# Patient Record
Sex: Male | Born: 1945
Health system: Southern US, Community
[De-identification: ages and names within clinical notes are randomized; demographics above are authoritative.]

## PROBLEM LIST (undated history)

## (undated) DIAGNOSIS — Z8719 Personal history of other diseases of the digestive system: Secondary | ICD-10-CM

## (undated) DIAGNOSIS — Z87898 Personal history of other specified conditions: Secondary | ICD-10-CM

## (undated) DIAGNOSIS — N400 Enlarged prostate without lower urinary tract symptoms: Secondary | ICD-10-CM

## (undated) DIAGNOSIS — T8859XA Other complications of anesthesia, initial encounter: Secondary | ICD-10-CM

## (undated) DIAGNOSIS — K219 Gastro-esophageal reflux disease without esophagitis: Secondary | ICD-10-CM

## (undated) DIAGNOSIS — R310 Gross hematuria: Secondary | ICD-10-CM

## (undated) HISTORY — PX: TONSILLECTOMY: SUR1361

## (undated) HISTORY — PX: OTHER SURGICAL HISTORY: SHX169

## (undated) HISTORY — PX: APPENDECTOMY: SHX54

---

## 2004-12-18 ENCOUNTER — Ambulatory Visit (HOSPITAL_COMMUNITY): Admission: RE | Admit: 2004-12-18 | Discharge: 2004-12-18 | Payer: Self-pay | Admitting: Gastroenterology

## 2009-10-18 HISTORY — PX: OTHER SURGICAL HISTORY: SHX169

## 2013-06-14 ENCOUNTER — Encounter (HOSPITAL_COMMUNITY): Payer: Self-pay

## 2013-06-14 ENCOUNTER — Inpatient Hospital Stay (HOSPITAL_COMMUNITY)
Admission: EM | Admit: 2013-06-14 | Discharge: 2013-06-18 | DRG: 585 | Disposition: A | Discharge status: Home / Self Care | Payer: BC Managed Care – PPO | Attending: Surgery | Admitting: Surgery

## 2013-06-14 ENCOUNTER — Emergency Department (HOSPITAL_COMMUNITY): Payer: BC Managed Care – PPO

## 2013-06-14 DIAGNOSIS — K440 Diaphragmatic hernia with obstruction, without gangrene: Principal | ICD-10-CM | POA: Diagnosis present

## 2013-06-14 DIAGNOSIS — Z880 Allergy status to penicillin: Secondary | ICD-10-CM

## 2013-06-14 DIAGNOSIS — IMO0002 Reserved for concepts with insufficient information to code with codable children: Secondary | ICD-10-CM | POA: Diagnosis not present

## 2013-06-14 DIAGNOSIS — K3189 Other diseases of stomach and duodenum: Secondary | ICD-10-CM | POA: Diagnosis present

## 2013-06-14 DIAGNOSIS — Y846 Urinary catheterization as the cause of abnormal reaction of the patient, or of later complication, without mention of misadventure at the time of the procedure: Secondary | ICD-10-CM | POA: Diagnosis not present

## 2013-06-14 DIAGNOSIS — N400 Enlarged prostate without lower urinary tract symptoms: Secondary | ICD-10-CM | POA: Diagnosis present

## 2013-06-14 DIAGNOSIS — R339 Retention of urine, unspecified: Secondary | ICD-10-CM | POA: Diagnosis not present

## 2013-06-14 DIAGNOSIS — R338 Other retention of urine: Secondary | ICD-10-CM

## 2013-06-14 LAB — CBC
MCH: 33.4 pg (ref 26.0–34.0)
Platelets: 325 10*3/uL (ref 150–400)
RBC: 4.97 MIL/uL (ref 4.22–5.81)
WBC: 12.8 10*3/uL — ABNORMAL HIGH (ref 4.0–10.5)

## 2013-06-14 LAB — LIPASE, BLOOD: Lipase: 133 U/L — ABNORMAL HIGH (ref 11–59)

## 2013-06-14 LAB — COMPREHENSIVE METABOLIC PANEL
AST: 23 U/L (ref 0–37)
Albumin: 4.1 g/dL (ref 3.5–5.2)
Chloride: 100 mEq/L (ref 96–112)
Creatinine, Ser: 0.85 mg/dL (ref 0.50–1.35)
Total Bilirubin: 0.9 mg/dL (ref 0.3–1.2)

## 2013-06-14 MED ORDER — LORAZEPAM 2 MG/ML IJ SOLN
1.0000 mg | Freq: Once | INTRAMUSCULAR | Status: AC
  Administered 2013-06-14: 1 mg via INTRAVENOUS
  Filled 2013-06-14: qty 1

## 2013-06-14 MED ORDER — MORPHINE SULFATE 4 MG/ML IJ SOLN
6.0000 mg | Freq: Once | INTRAMUSCULAR | Status: AC
  Administered 2013-06-14: 6 mg via INTRAVENOUS
  Filled 2013-06-14: qty 2

## 2013-06-14 MED ORDER — ONDANSETRON HCL 4 MG/2ML IJ SOLN
4.0000 mg | Freq: Once | INTRAMUSCULAR | Status: AC
  Administered 2013-06-14: 4 mg via INTRAVENOUS
  Filled 2013-06-14: qty 2

## 2013-06-14 MED ORDER — SODIUM CHLORIDE 0.9 % IV BOLUS (SEPSIS)
1000.0000 mL | Freq: Once | INTRAVENOUS | Status: AC
  Administered 2013-06-14: 1000 mL via INTRAVENOUS

## 2013-06-14 NOTE — ED Notes (Signed)
Patient transported to X-ray 

## 2013-06-14 NOTE — ED Notes (Signed)
Pt started vomiting and having abd pain shortly after eating dinner tonight that was cooked with a spicy chili

## 2013-06-15 ENCOUNTER — Emergency Department (HOSPITAL_COMMUNITY): Payer: BC Managed Care – PPO

## 2013-06-15 ENCOUNTER — Inpatient Hospital Stay (HOSPITAL_COMMUNITY): Payer: BC Managed Care – PPO | Admitting: Anesthesiology

## 2013-06-15 ENCOUNTER — Encounter (HOSPITAL_COMMUNITY): Payer: Self-pay | Admitting: Radiology

## 2013-06-15 ENCOUNTER — Encounter (HOSPITAL_COMMUNITY): Payer: Self-pay | Admitting: Anesthesiology

## 2013-06-15 ENCOUNTER — Encounter (HOSPITAL_COMMUNITY): Admission: EM | Disposition: A | Discharge status: Home / Self Care | Payer: Self-pay | Source: Home / Self Care

## 2013-06-15 DIAGNOSIS — K562 Volvulus: Secondary | ICD-10-CM

## 2013-06-15 DIAGNOSIS — K449 Diaphragmatic hernia without obstruction or gangrene: Secondary | ICD-10-CM

## 2013-06-15 DIAGNOSIS — K3189 Other diseases of stomach and duodenum: Secondary | ICD-10-CM | POA: Diagnosis present

## 2013-06-15 DIAGNOSIS — K440 Diaphragmatic hernia with obstruction, without gangrene: Secondary | ICD-10-CM | POA: Diagnosis present

## 2013-06-15 HISTORY — PX: LAPAROSCOPIC NISSEN FUNDOPLICATION: SHX1932

## 2013-06-15 SURGERY — FUNDOPLICATION, NISSEN, LAPAROSCOPIC
Anesthesia: General | Site: Abdomen | Wound class: Clean Contaminated

## 2013-06-15 MED ORDER — POTASSIUM CL IN DEXTROSE 5% 20 MEQ/L IV SOLN
20.0000 meq | INTRAVENOUS | Status: DC
  Administered 2013-06-15: 20 meq via INTRAVENOUS
  Filled 2013-06-15 (×4): qty 1000

## 2013-06-15 MED ORDER — PROMETHAZINE HCL 25 MG RE SUPP: 25.0000 mg | Freq: Four times a day (QID) | RECTAL | Status: DC | PRN

## 2013-06-15 MED ORDER — LACTATED RINGERS IV BOLUS (SEPSIS)
1000.0000 mL | Freq: Once | INTRAVENOUS | Status: AC
  Administered 2013-06-15: 1000 mL via INTRAVENOUS

## 2013-06-15 MED ORDER — LORAZEPAM 2 MG/ML IJ SOLN: 0.5000 mg | Freq: Three times a day (TID) | INTRAMUSCULAR | Status: DC | PRN

## 2013-06-15 MED ORDER — OXYCODONE HCL 5 MG PO TABS: 5.0000 mg | ORAL_TABLET | Freq: Once | ORAL | Status: DC | PRN

## 2013-06-15 MED ORDER — 0.9 % SODIUM CHLORIDE (POUR BTL) OPTIME
TOPICAL | Status: DC | PRN
  Administered 2013-06-15: 1000 mL

## 2013-06-15 MED ORDER — GENTAMICIN IN SALINE 1-0.9 MG/ML-% IV SOLN: 100.0000 mg | INTRAVENOUS | Status: DC

## 2013-06-15 MED ORDER — MAGIC MOUTHWASH
15.0000 mL | Freq: Four times a day (QID) | ORAL | Status: DC | PRN
  Filled 2013-06-15: qty 15

## 2013-06-15 MED ORDER — LACTATED RINGERS IV SOLN
INTRAVENOUS | Status: DC
  Administered 2013-06-15: 1000 mL via INTRAVENOUS
  Administered 2013-06-16: 01:00:00 via INTRAVENOUS

## 2013-06-15 MED ORDER — METOCLOPRAMIDE HCL 5 MG/ML IJ SOLN
5.0000 mg | Freq: Four times a day (QID) | INTRAMUSCULAR | Status: DC | PRN
  Administered 2013-06-15: 10 mg via INTRAVENOUS

## 2013-06-15 MED ORDER — MEPERIDINE HCL 50 MG/ML IJ SOLN: 6.2500 mg | INTRAMUSCULAR | Status: DC | PRN

## 2013-06-15 MED ORDER — MENTHOL 3 MG MT LOZG: 1.0000 | LOZENGE | OROMUCOSAL | Status: DC | PRN

## 2013-06-15 MED ORDER — LACTATED RINGERS IV SOLN
INTRAVENOUS | Status: DC
  Administered 2013-06-15: 1000 mL via INTRAVENOUS

## 2013-06-15 MED ORDER — DEXAMETHASONE SODIUM PHOSPHATE 10 MG/ML IJ SOLN
INTRAMUSCULAR | Status: DC | PRN
  Administered 2013-06-15: 10 mg via INTRAVENOUS

## 2013-06-15 MED ORDER — CLINDAMYCIN PHOSPHATE 600 MG/50ML IV SOLN
600.0000 mg | INTRAVENOUS | Status: DC
  Filled 2013-06-15: qty 50

## 2013-06-15 MED ORDER — NEOSTIGMINE METHYLSULFATE 1 MG/ML IJ SOLN
INTRAMUSCULAR | Status: DC | PRN
  Administered 2013-06-15: 4 mg via INTRAVENOUS

## 2013-06-15 MED ORDER — OXYCODONE HCL 5 MG/5ML PO SOLN
5.0000 mg | Freq: Once | ORAL | Status: DC | PRN
  Filled 2013-06-15: qty 5

## 2013-06-15 MED ORDER — PANTOPRAZOLE SODIUM 40 MG IV SOLR
40.0000 mg | Freq: Two times a day (BID) | INTRAVENOUS | Status: DC
  Administered 2013-06-15 – 2013-06-17 (×6): 40 mg via INTRAVENOUS
  Filled 2013-06-15 (×9): qty 40

## 2013-06-15 MED ORDER — GLYCOPYRROLATE 0.2 MG/ML IJ SOLN
INTRAMUSCULAR | Status: DC | PRN
  Administered 2013-06-15: 0.6 mg via INTRAVENOUS

## 2013-06-15 MED ORDER — ACETAMINOPHEN 650 MG RE SUPP
650.0000 mg | Freq: Four times a day (QID) | RECTAL | Status: DC | PRN
  Administered 2013-06-15: 650 mg via RECTAL
  Filled 2013-06-15: qty 1

## 2013-06-15 MED ORDER — METOPROLOL TARTRATE 1 MG/ML IV SOLN
5.0000 mg | Freq: Four times a day (QID) | INTRAVENOUS | Status: DC | PRN
  Filled 2013-06-15: qty 5

## 2013-06-15 MED ORDER — METOCLOPRAMIDE HCL 5 MG/ML IJ SOLN: 5.0000 mg | Freq: Four times a day (QID) | INTRAMUSCULAR | Status: DC | PRN

## 2013-06-15 MED ORDER — BUPIVACAINE-EPINEPHRINE 0.25% -1:200000 IJ SOLN
INTRAMUSCULAR | Status: AC
  Filled 2013-06-15: qty 1

## 2013-06-15 MED ORDER — MORPHINE SULFATE 4 MG/ML IJ SOLN
6.0000 mg | Freq: Once | INTRAMUSCULAR | Status: AC
  Administered 2013-06-15: 6 mg via INTRAVENOUS
  Filled 2013-06-15: qty 2

## 2013-06-15 MED ORDER — ONDANSETRON HCL 4 MG/2ML IJ SOLN
4.0000 mg | Freq: Four times a day (QID) | INTRAMUSCULAR | Status: DC | PRN
  Administered 2013-06-16: 4 mg via INTRAVENOUS
  Filled 2013-06-15: qty 2

## 2013-06-15 MED ORDER — HYDROMORPHONE HCL PF 1 MG/ML IJ SOLN
INTRAMUSCULAR | Status: DC | PRN
  Administered 2013-06-15 (×2): 1 mg via INTRAVENOUS

## 2013-06-15 MED ORDER — LIP MEDEX EX OINT
1.0000 "application " | TOPICAL_OINTMENT | Freq: Two times a day (BID) | CUTANEOUS | Status: DC
  Administered 2013-06-15 – 2013-06-17 (×4): 1 via TOPICAL

## 2013-06-15 MED ORDER — HYDROMORPHONE HCL PF 1 MG/ML IJ SOLN: 0.2500 mg | INTRAMUSCULAR | Status: DC | PRN

## 2013-06-15 MED ORDER — IOHEXOL 300 MG/ML  SOLN
100.0000 mL | Freq: Once | INTRAMUSCULAR | Status: AC | PRN
  Administered 2013-06-15: 100 mL via INTRAVENOUS

## 2013-06-15 MED ORDER — LIDOCAINE HCL (PF) 2 % IJ SOLN
INTRAMUSCULAR | Status: DC | PRN
  Administered 2013-06-15: 20 mg

## 2013-06-15 MED ORDER — FENTANYL CITRATE 0.05 MG/ML IJ SOLN
INTRAMUSCULAR | Status: DC | PRN
  Administered 2013-06-15: 50 ug via INTRAVENOUS
  Administered 2013-06-15: 100 ug via INTRAVENOUS
  Administered 2013-06-15 (×2): 50 ug via INTRAVENOUS

## 2013-06-15 MED ORDER — CLINDAMYCIN PHOSPHATE 900 MG/50ML IV SOLN
900.0000 mg | INTRAVENOUS | Status: AC
  Administered 2013-06-15: 900 mg via INTRAVENOUS
  Filled 2013-06-15: qty 50

## 2013-06-15 MED ORDER — POTASSIUM CL IN DEXTROSE 5% 20 MEQ/L IV SOLN
20.0000 meq | INTRAVENOUS | Status: DC
  Administered 2013-06-15: 20 meq via INTRAVENOUS
  Filled 2013-06-15: qty 1000

## 2013-06-15 MED ORDER — MIDAZOLAM HCL 5 MG/5ML IJ SOLN
INTRAMUSCULAR | Status: DC | PRN
  Administered 2013-06-15 (×2): 2 mg via INTRAVENOUS

## 2013-06-15 MED ORDER — BUPIVACAINE-EPINEPHRINE 0.25% -1:200000 IJ SOLN
INTRAMUSCULAR | Status: DC | PRN
  Administered 2013-06-15: 50 mL

## 2013-06-15 MED ORDER — GENTAMICIN SULFATE 40 MG/ML IJ SOLN
340.0000 mg | INTRAVENOUS | Status: AC
  Administered 2013-06-15: 340 mg via INTRAVENOUS
  Filled 2013-06-15: qty 8.5

## 2013-06-15 MED ORDER — ROCURONIUM BROMIDE 100 MG/10ML IV SOLN
INTRAVENOUS | Status: DC | PRN
  Administered 2013-06-15: 30 mg via INTRAVENOUS
  Administered 2013-06-15: 20 mg via INTRAVENOUS
  Administered 2013-06-15: 15 mg via INTRAVENOUS

## 2013-06-15 MED ORDER — HYDROMORPHONE HCL PF 1 MG/ML IJ SOLN: 1.0000 mg | INTRAMUSCULAR | Status: DC | PRN

## 2013-06-15 MED ORDER — SUCCINYLCHOLINE CHLORIDE 20 MG/ML IJ SOLN
INTRAMUSCULAR | Status: DC | PRN
  Administered 2013-06-15: 100 mg via INTRAVENOUS

## 2013-06-15 MED ORDER — ONDANSETRON HCL 4 MG PO TABS: 4.0000 mg | ORAL_TABLET | Freq: Three times a day (TID) | ORAL | Status: DC | PRN

## 2013-06-15 MED ORDER — OXYCODONE HCL 5 MG PO TABS: 5.0000 mg | ORAL_TABLET | ORAL | Status: DC | PRN

## 2013-06-15 MED ORDER — PROMETHAZINE HCL 25 MG/ML IJ SOLN: 6.2500 mg | INTRAMUSCULAR | Status: DC | PRN

## 2013-06-15 MED ORDER — PHENOL 1.4 % MT LIQD: 2.0000 | OROMUCOSAL | Status: DC | PRN

## 2013-06-15 MED ORDER — PROMETHAZINE HCL 25 MG/ML IJ SOLN: 12.5000 mg | Freq: Four times a day (QID) | INTRAMUSCULAR | Status: DC | PRN

## 2013-06-15 MED ORDER — ONDANSETRON HCL 4 MG/2ML IJ SOLN
INTRAMUSCULAR | Status: DC | PRN
  Administered 2013-06-15: 4 mg via INTRAVENOUS

## 2013-06-15 MED ORDER — IOHEXOL 300 MG/ML  SOLN
50.0000 mL | Freq: Once | INTRAMUSCULAR | Status: AC | PRN
  Administered 2013-06-15: 50 mL via ORAL

## 2013-06-15 MED ORDER — LACTATED RINGERS IR SOLN
Status: DC | PRN
  Administered 2013-06-15: 1000 mL

## 2013-06-15 MED ORDER — FAMOTIDINE IN NACL 20-0.9 MG/50ML-% IV SOLN
20.0000 mg | INTRAVENOUS | Status: DC
  Administered 2013-06-15 – 2013-06-17 (×3): 20 mg via INTRAVENOUS
  Filled 2013-06-15 (×5): qty 50

## 2013-06-15 MED ORDER — PROPOFOL 10 MG/ML IV BOLUS
INTRAVENOUS | Status: DC | PRN
  Administered 2013-06-15: 200 mg via INTRAVENOUS

## 2013-06-15 MED ORDER — ALUM & MAG HYDROXIDE-SIMETH 200-200-20 MG/5ML PO SUSP: 30.0000 mL | Freq: Four times a day (QID) | ORAL | Status: DC | PRN

## 2013-06-15 MED ORDER — DIPHENHYDRAMINE HCL 50 MG/ML IJ SOLN: 12.5000 mg | Freq: Four times a day (QID) | INTRAMUSCULAR | Status: DC | PRN

## 2013-06-15 MED ORDER — FENTANYL CITRATE 0.05 MG/ML IJ SOLN
100.0000 ug | Freq: Once | INTRAMUSCULAR | Status: AC
  Administered 2013-06-15: 100 ug via INTRAVENOUS
  Filled 2013-06-15: qty 2

## 2013-06-15 MED ORDER — BISACODYL 10 MG RE SUPP
10.0000 mg | Freq: Two times a day (BID) | RECTAL | Status: DC | PRN
  Administered 2013-06-16: 10 mg via RECTAL
  Filled 2013-06-15: qty 1

## 2013-06-15 SURGICAL SUPPLY — 67 items
APPLIER CLIP 5 13 M/L LIGAMAX5 (MISCELLANEOUS)
APPLIER CLIP ROT 10 11.4 M/L (STAPLE)
CABLE HIGH FREQUENCY MONO STRZ (ELECTRODE) ×2 IMPLANT
CANISTER SUCTION 2500CC (MISCELLANEOUS) IMPLANT
CLIP APPLIE 5 13 M/L LIGAMAX5 (MISCELLANEOUS) IMPLANT
CLIP APPLIE ROT 10 11.4 M/L (STAPLE) IMPLANT
CLOTH BEACON ORANGE TIMEOUT ST (SAFETY) ×2 IMPLANT
DECANTER SPIKE VIAL GLASS SM (MISCELLANEOUS) ×2 IMPLANT
DRAIN CHANNEL 19F RND (DRAIN) ×2 IMPLANT
DRAIN CHANNEL RND F F (WOUND CARE) IMPLANT
DRAIN PENROSE 18X1/2 LTX STRL (DRAIN) IMPLANT
DRAPE LAPAROSCOPIC ABDOMINAL (DRAPES) ×2 IMPLANT
DRAPE WARM FLUID 44X44 (DRAPE) ×2 IMPLANT
DRSG TEGADERM 2-3/8X2-3/4 SM (GAUZE/BANDAGES/DRESSINGS) ×8 IMPLANT
ELECT REM PT RETURN 9FT ADLT (ELECTROSURGICAL) ×2
ELECTRODE REM PT RTRN 9FT ADLT (ELECTROSURGICAL) ×1 IMPLANT
EVACUATOR SILICONE 100CC (DRAIN) ×2 IMPLANT
FELT TEFLON 4 X1 (Mesh General) ×2 IMPLANT
FILTER SMOKE EVAC LAPAROSHD (FILTER) ×2 IMPLANT
GLOVE BIOGEL PI IND STRL 7.0 (GLOVE) ×1 IMPLANT
GLOVE BIOGEL PI INDICATOR 7.0 (GLOVE) ×1
GLOVE ECLIPSE 8.0 STRL XLNG CF (GLOVE) IMPLANT
GLOVE INDICATOR 8.0 STRL GRN (GLOVE) IMPLANT
GLOVE SURG SS PI 7.5 STRL IVOR (GLOVE) ×4 IMPLANT
GLOVE SURG SS PI 8.5 STRL IVOR (GLOVE) ×1
GLOVE SURG SS PI 8.5 STRL STRW (GLOVE) ×1 IMPLANT
GOWN SRG XL XLNG 56XLVL 4 (GOWN DISPOSABLE) ×1 IMPLANT
GOWN STRL NON-REIN LRG LVL3 (GOWN DISPOSABLE) IMPLANT
GOWN STRL NON-REIN XL XLG LVL4 (GOWN DISPOSABLE) ×1
GOWN STRL REIN 2XL XLG LVL4 (GOWN DISPOSABLE) ×2 IMPLANT
GOWN STRL REIN XL XLG (GOWN DISPOSABLE) ×4 IMPLANT
KIT BASIN OR (CUSTOM PROCEDURE TRAY) ×2 IMPLANT
LEGGING LITHOTOMY PAIR STRL (DRAPES) ×2 IMPLANT
MARKER SKIN DUAL TIP RULER LAB (MISCELLANEOUS) ×2 IMPLANT
NS IRRIG 1000ML POUR BTL (IV SOLUTION) ×2 IMPLANT
PENCIL BUTTON HOLSTER BLD 10FT (ELECTRODE) IMPLANT
PUNCH BIOPSY 3 (MISCELLANEOUS) IMPLANT
SCALPEL HARMONIC ACE (MISCELLANEOUS) ×2 IMPLANT
SCISSORS LAP 5X35 DISP (ENDOMECHANICALS) ×2 IMPLANT
SEALANT SURGICAL APPL DUAL CAN (MISCELLANEOUS) IMPLANT
SET IRRIG TUBING LAPAROSCOPIC (IRRIGATION / IRRIGATOR) ×2 IMPLANT
SLEEVE Z-THREAD 5X100MM (TROCAR) ×6 IMPLANT
SOLUTION ANTI FOG 6CC (MISCELLANEOUS) IMPLANT
SPONGE DRAIN TRACH 4X4 STRL 2S (GAUZE/BANDAGES/DRESSINGS) IMPLANT
STAPLER VISISTAT 35W (STAPLE) ×2 IMPLANT
SUT 0 0 DBL MH NEEDLE ETHIBOND (SUTURE) IMPLANT
SUT ETHIBOND 0 (SUTURE) ×6 IMPLANT
SUT ETHIBOND 0 36 GRN (SUTURE) ×6 IMPLANT
SUT ETHIBOND CT1 BRD #0 30IN (SUTURE) IMPLANT
SUT ETHIBOND NAB CT1 #1 30IN (SUTURE) ×4 IMPLANT
SUT ETHILON 2 0 PS N (SUTURE) IMPLANT
SUT MNCRL AB 4-0 PS2 18 (SUTURE) ×2 IMPLANT
SUT PDS AB 3-0 SH 27 (SUTURE) IMPLANT
SUT PDS AB 4-0 SH 27 (SUTURE) IMPLANT
SUT VICRYL 0 TIES 12 18 (SUTURE) IMPLANT
TIP INNERVISION DETACH 40FR (MISCELLANEOUS) IMPLANT
TIP INNERVISION DETACH 50FR (MISCELLANEOUS) IMPLANT
TIP INNERVISION DETACH 56FR (MISCELLANEOUS) ×2 IMPLANT
TIPS INNERVISION DETACH 40FR (MISCELLANEOUS)
TOWEL OR 17X26 10 PK STRL BLUE (TOWEL DISPOSABLE) ×2 IMPLANT
TRAY FOLEY CATH 14FRSI W/METER (CATHETERS) IMPLANT
TRAY FOLEY METER SIL LF 16FR (CATHETERS) ×2 IMPLANT
TRAY LAP CHOLE (CUSTOM PROCEDURE TRAY) ×2 IMPLANT
TROCAR BLADELESS OPT 5 100 (ENDOMECHANICALS) ×2 IMPLANT
TROCAR XCEL NON-BLD 11X100MML (ENDOMECHANICALS) ×2 IMPLANT
TUBING FILTER THERMOFLATOR (ELECTROSURGICAL) ×2 IMPLANT
WATER STERILE IRR 1500ML POUR (IV SOLUTION) ×2 IMPLANT

## 2013-06-15 NOTE — Progress Notes (Signed)
PACU Nursing Note: Pt became extremely combative, hitting, bitting, scratching at staff, pt attempting to get out of bed, pt states he is at home. Safety devices applied to wrist, sr x 4 remain up and bed in lowest position, 3 RN's with MD required to keep pt in bed and fighting with staff, MDA at bedside, w/ CRNA, 2mg  Versed IV administered by CRNA, pt placed on ETCO2 along with cont POX monitoring, additional orders rec'd from Surgeon and MDA.

## 2013-06-15 NOTE — ED Provider Notes (Addendum)
CSN: [id number]     Arrival date & time 06/14/13  2214 History   First MD Initiated Contact with Patient 06/14/13 2309     Chief Complaint  Patient presents with  . Emesis    HPI Patient reports developing acute onset abdominal pain in his upper abdomen as well as associated nausea and vomiting shortly after finishing dinner.  He has a known history of hiatal hernia.  He denies diarrhea.  He was in his normal state of health earlier today.  He's never had these symptoms before.  History gallstones.  He has had an appendectomy before in the past.  His pain is moderate to severe in severity at this time.  Nothing worsens or improves his symptoms.  He is actively vomiting during my examination.   History reviewed. No pertinent past medical history. History reviewed. No pertinent past surgical history. History reviewed. No pertinent family history. History  Substance Use Topics  . Smoking status: Never Smoker   . Smokeless tobacco: Not on file  . Alcohol Use: No    Review of Systems  All other systems reviewed and are negative.    Allergies  Penicillins and Betadine  Home Medications   Current Outpatient Rx  Name  Route  Sig  Dispense  Refill  . aluminum & magnesium hydroxide-simethicone (MYLANTA) 500-450-40 MG/5ML suspension   Oral   Take by mouth every 6 (six) hours as needed for indigestion.         Marland Kitchen aspirin 325 MG tablet   Oral   Take 325 mg by mouth daily.         . calcium carbonate (TUMS - DOSED IN MG ELEMENTAL CALCIUM) 500 MG chewable tablet   Oral   Chew 1 tablet by mouth daily.         . famotidine (PEPCID) 20 MG tablet   Oral   Take 20 mg by mouth 2 (two) times daily.         Marland Kitchen ibuprofen (ADVIL,MOTRIN) 200 MG tablet   Oral   Take 200 mg by mouth every 6 (six) hours as needed for pain.          BP 163/88  Pulse 98  Temp(Src) 98.7 F (37.1 C) (Oral)  Resp 16  SpO2 95% Physical Exam  Nursing note and vitals reviewed. Constitutional: He is  oriented to person, place, and time. He appears well-developed and well-nourished.  Uncomfortable appearing  HENT:  Head: Normocephalic and atraumatic.  Eyes: EOM are normal.  Neck: Normal range of motion.  Cardiovascular: Normal rate, regular rhythm, normal heart sounds and intact distal pulses.   Pulmonary/Chest: Effort normal and breath sounds normal. No respiratory distress.  Abdominal: Soft. He exhibits no distension.  Abdominal tenderness without guarding or rebound.  Genitourinary: Rectum normal.  Musculoskeletal: Normal range of motion.  Neurological: He is alert and oriented to person, place, and time.  Skin: Skin is warm and dry.  Psychiatric: He has a normal mood and affect. Judgment normal.    ED Course  Procedures (including critical care time) Labs Review Labs Reviewed  CBC - Abnormal; Notable for the following:    WBC 12.8 (*)    All other components within normal limits  LIPASE, BLOOD - Abnormal; Notable for the following:    Lipase 133 (*)    All other components within normal limits  COMPREHENSIVE METABOLIC PANEL - Abnormal; Notable for the following:    Glucose, Bld 145 (*)    GFR calc non Af Denyse Dago  89 (*)    All other components within normal limits  TROPONIN I    Date: 06/15/2013  Rate: 87  Rhythm: normal sinus rhythm  QRS Axis: normal  Intervals: normal  ST/T Wave abnormalities: normal  Conduction Disutrbances: none  Narrative Interpretation:   Old EKG Reviewed: No significant changes noted     Imaging Review Dg Chest 1 View  06/15/2013   *RADIOLOGY REPORT*  Clinical Data: Emesis, hiatal hernia  CHEST - 1 VIEW  Comparison: 06/15/2013  Findings: Increased gas and decreased fluid within the large hernia though the degree of distension is similar.  The NG tube  descends straight into the subdiaphragmatic level/left upper quadrant, raising possibility that the hernia is paresophageal.  IMPRESSION: NG tube tip over the left upper quadrant.  Findings as  above.   Original Report Authenticated By: Jearld Lesch, M.D.   Ct Abdomen Pelvis W Contrast  06/15/2013   *RADIOLOGY REPORT*  Clinical Data: Emesis, hiatal hernia.  CT ABDOMEN AND PELVIS WITH CONTRAST  Technique:  Multidetector CT imaging of the abdomen and pelvis was performed following the standard protocol during bolus administration of intravenous contrast.  Contrast: OMNIPAQUE IOHEXOL 300 MG/ML  SOLN  Comparison: 06/15/2013 radiographs  Findings: Mild left lung base opacity, favor atelectasis.  Normal heart size.  Coronary artery calcifications.  Intrathoracic stomach with organoaxial rotation.  Narrowing of the gastric outlet due to the hernia with no contrast reaching the duodenum.  NG tube descends into the stomach. No pneumatosis.  A couple subcentimeter hepatic hypodensities are nonspecific however favored to reflect biliary cyst or hamartoma.  Hypodensity adjacent to the falciform, favored to reflect focal fat or variant perfusion.  Partially contracted gallbladder with mild wall thickening, nonspecific.  No biliary ductal dilatation.  No radiodense gallstones.  Unremarkable spleen, pancreas, adrenal glands.  Subcentimeter hypodensity right kidney.  Otherwise, symmetric renal enhancement.  No hydroureteronephrosis.  No CT evidence for colitis.  Appendix not identified.  No right lower quadrant inflammation.  Small bowel loops are decompressed and normal in course.  No free intraperitoneal air or fluid.  No lymphadenopathy.  Thin-walled bladder.  Marked prostatomegaly with heterogeneous/nodular enhancement.  Tiny fat containing left umbilical hernia.  Normal caliber aorta and branch vessels with mild scattered atherosclerotic disease.  No acute osseous finding.  Mild multilevel degenerative change.  IMPRESSION: Findings are most in keeping with obstructing organoaxial gastric volvulus. Recommend surgical consultation.  Marked prostatomegaly with heterogeneous/nodular enhancement. Recommend  non emergent workup to include PSA and digital rectal exam.  Discussed via telephone with Dr. Patria Mane at 03:50 a.m. on 06/15/2013.   Original Report Authenticated By: Jearld Lesch, M.D.   Dg Abd Acute W/chest  06/15/2013   *RADIOLOGY REPORT*  Clinical Data: Upper abdominal pain  ACUTE ABDOMEN SERIES (ABDOMEN 2 VIEW & CHEST 1 VIEW)  Comparison: None.  Findings: Lungs predominately clear.  Large hiatal hernia. Cardiomediastinal contours otherwise within normal range.  No free intraperitoneal air.  Large stool burden.  Relative paucity of small bowel gas.  Mild curvature of the spine.  Osteopenia.  IMPRESSION: Large hiatal hernia and a paucity of small bowel gas, can be seen with fluid filled or decompressed loops. Recommend NG tube decompression and cross-sectional imaging if clinically concerned for acute gastric obstruction or volvulus.  Large stool burden.   Original Report Authenticated By: Jearld Lesch, M.D.   I personally reviewed the imaging tests through PACS system I reviewed available ER/hospitalization records through the EMR I personally discussed the findings  with the radiologist  MDM   1. Organoaxial gastric volvulus    4:05 AM CT scan concerning for organoaxial gastric volvulus.  NG tube was placed earlier in the patient's ER stay.  Pain is been treated nausea is been treated.  NG is on intermittent suction at this time.  General surgery consultation pending    Lyanne Co, MD 06/15/13 4098  Lyanne Co, MD 06/15/13 [phone]

## 2013-06-15 NOTE — ED Notes (Signed)
Patient transported to CT 

## 2013-06-15 NOTE — Preoperative (Signed)
Beta Blockers   Reason not to administer Beta Blockers:Not Applicable 

## 2013-06-15 NOTE — Progress Notes (Signed)
Nutrition Education Note  Received consult for diet education s/p gastric fundoplication. Pt scheduled to have this surgery later on today and is currently NPO. Met with pt and wife to discuss postoperative diet. Instructed them on pureed diet with emphasis on small frequent meals low in fat and sugar and avoiding carbonated beverages/straws. Teach back method used. Handouts provided.   Expect good compliance.  Body mass index is 23.31 kg/(m^2). Pt meets criteria for normal weight based on current BMI.  Labs and medications reviewed. Pt with elevated lipase. Pt eating well PTA, smaller meals r/t early satiety. Had 5 pound intentional weight loss since May r/t increasing exercising. No further nutrition interventions warranted at this time. RD contact information provided. If additional nutrition issues arise, please re-consult RD.    Levon Hedger MS, RD, LDN [phone] Pager [phone] After Hours Pager

## 2013-06-15 NOTE — Progress Notes (Signed)
ACU Nursing Note: Re-assessment: pt resting very quietly at this time, will attempt to open eyes when name called, no further combative activity noted, wrist safety devices remain on, side rails x 4 remain up with bed remaining in lowest position, PACU RN at bedside continously, VS frequency changed to q91min for monitoring, MDA at bedside, updated of pt status, no further orders rec'd

## 2013-06-15 NOTE — Progress Notes (Signed)
Lee Collins [id number] 04/13/1946  CARE TEAM:  PCP: No primary provider on file.  Outpatient Care Team: Patient has no care team.  Inpatient Treatment Team: Treatment Team: Attending Provider: Md Montez Morita, MD; Technician: Areta Haber, [place]; Consulting Physician: Ardeth Sportsman, MD; Respiratory Therapist: Toula Moos, RRT   Subjective:  NGT in place C/o dry mouth No more nausea or vomiting. No abdominal pain. Wife in room.  Given my expertise in advance laparoscopic and foregut surgery, I was asked by my partner to evaluate the patient.   The patient is familiar with & trusts Dr. Gerrit Friends.  They feel comfortable with me being involved in his surgical care  The patient has a history of a known hiatal hernia for some time.  He thought it been mild in the past.  Struggled with significant reflux intermittently.  Has used Maalox and anti-gas medications very regularly.  Has some early satiety with gradual transition to smaller & more frequent meals.  This has worsened to the point of severe bloating and pain with eating, especially with the emergency room visit last night.    No prior history of dysphagia to solids or liquids.  No family history of esophageal cancer.  He does not smoke.  He is quite physically active and runs every day.  He had an appendectomy but no other abdominal surgeries.  No cardiac or pulmonary issues.  He has never had an upper endoscopy.  He has been evaluated by Dorena Cookey with [facility] gastroenterology in the past for colonoscopies.  He has never had any upper endoscopies done.  No personal nor family history of GI/colon cancer, inflammatory bowel disease, irritable bowel syndrome, allergy such as Celiac Sprue, dietary/dairy problems, colitis, ulcers nor gastritis.  No recent sick contacts/gastroenteritis.  No travel outside the country.  No changes in diet.    Objective:  Vital signs:  Filed Vitals:   06/14/13 2226 06/15/13 0050 06/15/13 0427  BP:  166/93 163/88 132/73  Pulse: 98  96  Temp: 98.7 F (37.1 C)  98.5 F (36.9 C)  TempSrc: Oral  Oral  Resp: 18 16 20   SpO2: 97% 95% 93%       Intake/Output   Yesterday:    This shift:     Bowel function:  Flatus: n BM: n  Drain: n/a  Physical Exam:  General: Pt awake/alert/oriented x4 in no acute distress Eyes: PERRL, normal EOM.  Sclera clear.  No icterus Neuro: CN II-XII intact w/o focal sensory/motor deficits. Lymph: No head/neck/groin lymphadenopathy Psych:  No delerium/psychosis/paranoia HENT: Normocephalic, Mucus membranes moist.  No thrush.  NG tube and left Neer.  No bleeding.  No hoarseness. Neck: Supple, No tracheal deviation Chest: No chest wall pain w good excursion CV:  Pulses intact.  Regular rhythm MS: Normal AROM mjr joints.  No obvious deformity Abdomen: Soft.  Nondistended.  Nontender.  No rebound or guarding.  No evidence of peritonitis.  No incarcerated hernias. Ext:  SCDs BLE.  No mjr edema.  No cyanosis Skin: No petechiae / purpura   Problem List:   Principal Problem:   Organoaxial gastric volvulus Active Problems:   Incarcerated paraesophageal hernia   Assessment  Lee Collins  67 y.o. male       Large paraesophageal hiatal hernia with 80% incarcerated in the right and left mediastinum, Volvulized.  Resulting in gastric obstruction.  Plan:  -I think at this point he is fully obstructed.  He requires urgent decompression of his incarcerated paraesophageal hiatal hernia with  hiatal closure and probable fundoplication:  The anatomy & physiology of the foregut and anti-reflux mechanism was discussed.  The pathophysiology of hiatal herniation and GERD was discussed.  Natural history risks without surgery was discussed.   The patient's symptoms are not adequately controlled by medicines and other non-operative treatments.  I feel the risks of no intervention will lead to serious problems that outweigh the operative risks; therefore, I  recommended surgery to reduce the hiatal hernia out of the chest and fundoplication to rebuild the anti-reflux valve and control reflux better.  Need for a thorough workup to rule out the differential diagnosis and plan treatment was explained.  I explained laparoscopic techniques with possible need for an open approach.  Risks such as bleeding, infection, abscess, leak, need for further treatment, heart attack, death, and other risks were discussed.   I noted a good likelihood this will help address the problem.  Goals of post-operative recovery were discussed as well.  Possibility that this will not correct all symptoms was explained.  Post-operative dysphagia, need for short-term liquid & pureed diet, inability to vomit, possibility of reherniation, possible need for medicines to help control symptoms in addition to surgery were discussed.  We will work to minimize complications.   Educational handouts further explaining the pathology, treatment options, and dysphagia diet was given as well.  Questions were answered.  The patient expresses understanding & wishes to proceed with surgery.   We will try and do this later today.  It is reasonable start out laparoscopically.  The hiatal defect is not with massive, so bili can be just a primary care.  Sometimes biologic mesh is used to buttress the repair.  I discussed this at length with the patient and his wife.  They wish to be aggressive and proceed with surgery.  Given the lack of preoperative manometry, we will probably do short fundoplication.  Hold off on gastrostomy tube unless difficult to fully reduce the  -VTE prophylaxis- SCDs, etc -mobilize as tolerated to help recovery  Ardeth Sportsman, M.D., F.A.C.S. Gastrointestinal and Minimally Invasive Surgery [facility] [address], [address] [place], [place] [address] [phone] Main / Paging   06/15/2013   Results:   Labs: Results for orders placed during the  hospital encounter of 06/14/13 (from the past 48 hour(s))  TROPONIN I     Status: None   Collection Time    06/14/13  9:30 PM      Result Value Range   Troponin I <0.30  <0.30 ng/mL   Comment:            Due to the release kinetics of cTnI,     a negative result within the first hours     of the onset of symptoms does not rule out     myocardial infarction with certainty.     If myocardial infarction is still suspected,     repeat the test at appropriate intervals.  CBC     Status: Abnormal   Collection Time    06/14/13 11:10 PM      Result Value Range   WBC 12.8 (*) 4.0 - 10.5 K/uL   RBC 4.97  4.22 - 5.81 MIL/uL   Hemoglobin 16.6  13.0 - 17.0 g/dL   HCT 47.4  25.9 - 56.3 %   MCV 94.2  78.0 - 100.0 fL   MCH 33.4  26.0 - 34.0 pg   MCHC 35.5  30.0 - 36.0 g/dL  RDW 11.9  11.5 - 15.5 %   Platelets 325  150 - 400 K/uL  LIPASE, BLOOD     Status: Abnormal   Collection Time    06/14/13 11:10 PM      Result Value Range   Lipase 133 (*) 11 - 59 U/L  COMPREHENSIVE METABOLIC PANEL     Status: Abnormal   Collection Time    06/14/13 11:10 PM      Result Value Range   Sodium 139  135 - 145 mEq/L   Potassium 3.6  3.5 - 5.1 mEq/L   Chloride 100  96 - 112 mEq/L   CO2 29  19 - 32 mEq/L   Glucose, Bld 145 (*) 70 - 99 mg/dL   BUN 18  6 - 23 mg/dL   Creatinine, Ser 9.60  0.50 - 1.35 mg/dL   Calcium 9.6  8.4 - 45.4 mg/dL   Total Protein 7.5  6.0 - 8.3 g/dL   Albumin 4.1  3.5 - 5.2 g/dL   AST 23  0 - 37 U/L   ALT 20  0 - 53 U/L   Alkaline Phosphatase 77  39 - 117 U/L   Total Bilirubin 0.9  0.3 - 1.2 mg/dL   GFR calc non Af Amer 89 (*) >90 mL/min   GFR calc Af Amer >90  >90 mL/min   Comment: (NOTE)     The eGFR has been calculated using the CKD EPI equation.     This calculation has not been validated in all clinical situations.     eGFR's persistently <90 mL/min signify possible Chronic Kidney     Disease.    Imaging / Studies: Dg Chest 1 View  06/15/2013   *RADIOLOGY REPORT*   Clinical Data: Emesis, hiatal hernia  CHEST - 1 VIEW  Comparison: 06/15/2013  Findings: Increased gas and decreased fluid within the large hernia though the degree of distension is similar.  The NG tube  descends straight into the subdiaphragmatic level/left upper quadrant, raising possibility that the hernia is paresophageal.  IMPRESSION: NG tube tip over the left upper quadrant.  Findings as above.   Original Report Authenticated By: Jearld Lesch, M.D.   Ct Abdomen Pelvis W Contrast  06/15/2013   *RADIOLOGY REPORT*  Clinical Data: Emesis, hiatal hernia.  CT ABDOMEN AND PELVIS WITH CONTRAST  Technique:  Multidetector CT imaging of the abdomen and pelvis was performed following the standard protocol during bolus administration of intravenous contrast.  Contrast: OMNIPAQUE IOHEXOL 300 MG/ML  SOLN  Comparison: 06/15/2013 radiographs  Findings: Mild left lung base opacity, favor atelectasis.  Normal heart size.  Coronary artery calcifications.  Intrathoracic stomach with organoaxial rotation.  Narrowing of the gastric outlet due to the hernia with no contrast reaching the duodenum.  NG tube descends into the stomach. No pneumatosis.  A couple subcentimeter hepatic hypodensities are nonspecific however favored to reflect biliary cyst or hamartoma.  Hypodensity adjacent to the falciform, favored to reflect focal fat or variant perfusion.  Partially contracted gallbladder with mild wall thickening, nonspecific.  No biliary ductal dilatation.  No radiodense gallstones.  Unremarkable spleen, pancreas, adrenal glands.  Subcentimeter hypodensity right kidney.  Otherwise, symmetric renal enhancement.  No hydroureteronephrosis.  No CT evidence for colitis.  Appendix not identified.  No right lower quadrant inflammation.  Small bowel loops are decompressed and normal in course.  No free intraperitoneal air or fluid.  No lymphadenopathy.  Thin-walled bladder.  Marked prostatomegaly with heterogeneous/nodular  enhancement.  Tiny fat  containing left umbilical hernia.  Normal caliber aorta and branch vessels with mild scattered atherosclerotic disease.  No acute osseous finding.  Mild multilevel degenerative change.  IMPRESSION: Findings are most in keeping with obstructing organoaxial gastric volvulus. Recommend surgical consultation.  Marked prostatomegaly with heterogeneous/nodular enhancement. Recommend non emergent workup to include PSA and digital rectal exam.  Discussed via telephone with Dr. Patria Mane at 03:50 a.m. on 06/15/2013.   Original Report Authenticated By: Jearld Lesch, M.D.   Dg Abd Acute W/chest  06/15/2013   *RADIOLOGY REPORT*  Clinical Data: Upper abdominal pain  ACUTE ABDOMEN SERIES (ABDOMEN 2 VIEW & CHEST 1 VIEW)  Comparison: None.  Findings: Lungs predominately clear.  Large hiatal hernia. Cardiomediastinal contours otherwise within normal range.  No free intraperitoneal air.  Large stool burden.  Relative paucity of small bowel gas.  Mild curvature of the spine.  Osteopenia.  IMPRESSION: Large hiatal hernia and a paucity of small bowel gas, can be seen with fluid filled or decompressed loops. Recommend NG tube decompression and cross-sectional imaging if clinically concerned for acute gastric obstruction or volvulus.  Large stool burden.   Original Report Authenticated By: Jearld Lesch, M.D.    Medications / Allergies: per chart  Antibiotics: Anti-infectives   None

## 2013-06-15 NOTE — Anesthesia Postprocedure Evaluation (Signed)
  Anesthesia Post-op Note  Patient: Lee Collins  Procedure(s) Performed: Procedure(s) (LRB): LAPAROSCOPIC PARAESOPHAGEAL HERNIA   (N/A)  Patient Location: PACU  Anesthesia Type: General  Level of Consciousness: awake and alert   Airway and Oxygen Therapy: Patient Spontanous Breathing  Post-op Pain: mild  Post-op Assessment: Post-op Vital signs reviewed, Patient's Cardiovascular Status Stable, Respiratory Function Stable, Patent Airway and No signs of Nausea or vomiting  Last Vitals:  Filed Vitals:   06/15/13 2050  BP: 126/60  Pulse: 74  Temp:   Resp: 12    Post-op Vital Signs: stable   Complications: No apparent anesthesia complications

## 2013-06-15 NOTE — Anesthesia Preprocedure Evaluation (Addendum)
Anesthesia Evaluation  Patient identified by MRN, date of birth, ID band Patient awake    Reviewed: Allergy & Precautions, H&P , NPO status , Patient's Chart, lab work & pertinent test results  Airway Mallampati: II TM Distance: >3 FB Neck ROM: Full    Dental no notable dental hx. (+) Dental Advisory Given   Pulmonary neg pulmonary ROS,  breath sounds clear to auscultation  Pulmonary exam normal       Cardiovascular negative cardio ROS  Rhythm:Regular Rate:Normal     Neuro/Psych negative neurological ROS  negative psych ROS   GI/Hepatic negative GI ROS, Neg liver ROS,   Endo/Other  negative endocrine ROS  Renal/GU negative Renal ROS  negative genitourinary   Musculoskeletal negative musculoskeletal ROS (+)   Abdominal   Peds negative pediatric ROS (+)  Hematology negative hematology ROS (+)   Anesthesia Other Findings   Reproductive/Obstetrics negative OB ROS                          Anesthesia Physical Anesthesia Plan  ASA: II  Anesthesia Plan: General   Post-op Pain Management:    Induction: Intravenous, Rapid sequence and Cricoid pressure planned  Airway Management Planned: Oral ETT  Additional Equipment:   Intra-op Plan:   Post-operative Plan: Extubation in OR  Informed Consent: I have reviewed the patients History and Physical, chart, labs and discussed the procedure including the risks, benefits and alternatives for the proposed anesthesia with the patient or authorized representative who has indicated his/her understanding and acceptance.   Dental advisory given  Plan Discussed with: CRNA and Surgeon  Anesthesia Plan Comments:        Anesthesia Quick Evaluation

## 2013-06-15 NOTE — Progress Notes (Signed)
PACU Nursing Note: Pt ready for discharge from PACU, pt arousable, follows commands, able to take deep breaths, VSS, pt able to state name, reason for hospitalization and states location. Safety devices removed prior to tx out of PACU.

## 2013-06-15 NOTE — ED Notes (Signed)
Output of 50ml per NG tube.

## 2013-06-15 NOTE — Transfer of Care (Signed)
Immediate Anesthesia Transfer of Care Note  Patient: Lee Collins  Procedure(s) Performed: Procedure(s) (LRB): LAPAROSCOPIC PARAESOPHAGEAL HERNIA   (N/A)  Patient Location: PACU  Anesthesia Type: General  Level of Consciousness: sedated, patient cooperative and responds to stimulaton  Airway & Oxygen Therapy: Patient Spontanous Breathing and Patient connected to face mask oxgen  Post-op Assessment: Report given to PACU RN and Post -op Vital signs reviewed and stable  Post vital signs: Reviewed and stable  Complications: No apparent anesthesia complications

## 2013-06-15 NOTE — ED Notes (Signed)
Pt able to tolerate approximately 1 1/2 cup of contrast via NG tube before feeling like he was going to vomit.

## 2013-06-15 NOTE — H&P (Signed)
Lee Collins is an 67 y.o. male.   Chief Complaint: nausea vomiting after eating tonight and pain HPI: asked to see patient at request of Dr. Patria Mane for acute onset of nausea vomiting and upper abdominal pain. This occurred after dinner last night. The pain is severe located in the upper abdomen and lower chest and is associated with nausea and vomiting. Patient has history of hiatal hernia. He has never had pain like this before. CT scan showed large hiatal hernia with gastric volvulus and obstruction. Patient feels better since nasogastric tube was placed. No hypotension. No tachycardia.   History reviewed. No pertinent past medical history.  History reviewed. No pertinent past surgical history.  History reviewed. No pertinent family history. Social History:  reports that he has never smoked. He does not have any smokeless tobacco history on file. He reports that he does not drink alcohol. His drug history is not on file.  Allergies:  Allergies  Allergen Reactions  . Penicillins   . Betadine [Povidone Iodine] Rash     (Not in a hospital admission)  Results for orders placed during the hospital encounter of 06/14/13 (from the past 48 hour(s))  TROPONIN I     Status: None   Collection Time    06/14/13  9:30 PM      Result Value Range   Troponin I <0.30  <0.30 ng/mL   Comment:            Due to the release kinetics of cTnI,     a negative result within the first hours     of the onset of symptoms does not rule out     myocardial infarction with certainty.     If myocardial infarction is still suspected,     repeat the test at appropriate intervals.  CBC     Status: Abnormal   Collection Time    06/14/13 11:10 PM      Result Value Range   WBC 12.8 (*) 4.0 - 10.5 K/uL   RBC 4.97  4.22 - 5.81 MIL/uL   Hemoglobin 16.6  13.0 - 17.0 g/dL   HCT 40.9  81.1 - 91.4 %   MCV 94.2  78.0 - 100.0 fL   MCH 33.4  26.0 - 34.0 pg   MCHC 35.5  30.0 - 36.0 g/dL   RDW 78.2  95.6 - 21.3 %   Platelets 325  150 - 400 K/uL  LIPASE, BLOOD     Status: Abnormal   Collection Time    06/14/13 11:10 PM      Result Value Range   Lipase 133 (*) 11 - 59 U/L  COMPREHENSIVE METABOLIC PANEL     Status: Abnormal   Collection Time    06/14/13 11:10 PM      Result Value Range   Sodium 139  135 - 145 mEq/L   Potassium 3.6  3.5 - 5.1 mEq/L   Chloride 100  96 - 112 mEq/L   CO2 29  19 - 32 mEq/L   Glucose, Bld 145 (*) 70 - 99 mg/dL   BUN 18  6 - 23 mg/dL   Creatinine, Ser 0.86  0.50 - 1.35 mg/dL   Calcium 9.6  8.4 - 57.8 mg/dL   Total Protein 7.5  6.0 - 8.3 g/dL   Albumin 4.1  3.5 - 5.2 g/dL   AST 23  0 - 37 U/L   ALT 20  0 - 53 U/L   Alkaline Phosphatase 77  39 - 117  U/L   Total Bilirubin 0.9  0.3 - 1.2 mg/dL   GFR calc non Af Amer 89 (*) >90 mL/min   GFR calc Af Amer >90  >90 mL/min   Comment: (NOTE)     The eGFR has been calculated using the CKD EPI equation.     This calculation has not been validated in all clinical situations.     eGFR's persistently <90 mL/min signify possible Chronic Kidney     Disease.   Dg Chest 1 View  06/15/2013   *RADIOLOGY REPORT*  Clinical Data: Emesis, hiatal hernia  CHEST - 1 VIEW  Comparison: 06/15/2013  Findings: Increased gas and decreased fluid within the large hernia though the degree of distension is similar.  The NG tube  descends straight into the subdiaphragmatic level/left upper quadrant, raising possibility that the hernia is paresophageal.  IMPRESSION: NG tube tip over the left upper quadrant.  Findings as above.   Original Report Authenticated By: Jearld Lesch, M.D.   Ct Abdomen Pelvis W Contrast  06/15/2013   *RADIOLOGY REPORT*  Clinical Data: Emesis, hiatal hernia.  CT ABDOMEN AND PELVIS WITH CONTRAST  Technique:  Multidetector CT imaging of the abdomen and pelvis was performed following the standard protocol during bolus administration of intravenous contrast.  Contrast: OMNIPAQUE IOHEXOL 300 MG/ML  SOLN  Comparison: 06/15/2013  radiographs  Findings: Mild left lung base opacity, favor atelectasis.  Normal heart size.  Coronary artery calcifications.  Intrathoracic stomach with organoaxial rotation.  Narrowing of the gastric outlet due to the hernia with no contrast reaching the duodenum.  NG tube descends into the stomach. No pneumatosis.  A couple subcentimeter hepatic hypodensities are nonspecific however favored to reflect biliary cyst or hamartoma.  Hypodensity adjacent to the falciform, favored to reflect focal fat or variant perfusion.  Partially contracted gallbladder with mild wall thickening, nonspecific.  No biliary ductal dilatation.  No radiodense gallstones.  Unremarkable spleen, pancreas, adrenal glands.  Subcentimeter hypodensity right kidney.  Otherwise, symmetric renal enhancement.  No hydroureteronephrosis.  No CT evidence for colitis.  Appendix not identified.  No right lower quadrant inflammation.  Small bowel loops are decompressed and normal in course.  No free intraperitoneal air or fluid.  No lymphadenopathy.  Thin-walled bladder.  Marked prostatomegaly with heterogeneous/nodular enhancement.  Tiny fat containing left umbilical hernia.  Normal caliber aorta and branch vessels with mild scattered atherosclerotic disease.  No acute osseous finding.  Mild multilevel degenerative change.  IMPRESSION: Findings are most in keeping with obstructing organoaxial gastric volvulus. Recommend surgical consultation.  Marked prostatomegaly with heterogeneous/nodular enhancement. Recommend non emergent workup to include PSA and digital rectal exam.  Discussed via telephone with Dr. Patria Mane at 03:50 a.m. on 06/15/2013.   Original Report Authenticated By: Jearld Lesch, M.D.   Dg Abd Acute W/chest  06/15/2013   *RADIOLOGY REPORT*  Clinical Data: Upper abdominal pain  ACUTE ABDOMEN SERIES (ABDOMEN 2 VIEW & CHEST 1 VIEW)  Comparison: None.  Findings: Lungs predominately clear.  Large hiatal hernia. Cardiomediastinal contours  otherwise within normal range.  No free intraperitoneal air.  Large stool burden.  Relative paucity of small bowel gas.  Mild curvature of the spine.  Osteopenia.  IMPRESSION: Large hiatal hernia and a paucity of small bowel gas, can be seen with fluid filled or decompressed loops. Recommend NG tube decompression and cross-sectional imaging if clinically concerned for acute gastric obstruction or volvulus.  Large stool burden.   Original Report Authenticated By: Jearld Lesch, M.D.  Review of Systems  Constitutional: Negative for fever and chills.  HENT: Negative.   Eyes: Negative.   Respiratory: Negative.   Cardiovascular: Negative.   Gastrointestinal: Positive for nausea, vomiting and abdominal pain.  Genitourinary: Negative.   Musculoskeletal: Negative.   Skin: Negative for rash.  Neurological: Negative.   Endo/Heme/Allergies: Negative.   Psychiatric/Behavioral: Negative.     Blood pressure 132/73, pulse 96, temperature 98.5 F (36.9 C), temperature source Oral, resp. rate 20, SpO2 93.00%. Physical Exam  Constitutional: He is oriented to person, place, and time. He appears well-developed and well-nourished.  HENT:  Head: Normocephalic and atraumatic.  Eyes: EOM are normal. Pupils are equal, round, and reactive to light.  Neck: Normal range of motion. Neck supple.  Cardiovascular: Normal rate and regular rhythm.   Respiratory: Effort normal and breath sounds normal.  GI: Soft. He exhibits no distension. There is no tenderness.  Musculoskeletal: Normal range of motion.  Neurological: He is alert and oriented to person, place, and time.  Skin: Skin is warm and dry.  Psychiatric: He has a normal mood and affect. His behavior is normal. Judgment and thought content normal.     Assessment/Plan History of hiatal hernia with acute gastric volvulus with obstruction.  I discussed the disease process with the patient and his wife at the bedside. Currently he feels better with the  NG tube in place. I explained the need for exploratory laparotomy with probable gastropexy or gastrostomy tube and closure of hiatus if possible. I discussed the progression of this process which could lead to gastric perforation, gastric gangrene and subsequent removal stomach. Currently, he has no signs of this process. The patient and wife wish time to think about this. I explained the operative procedure the patient. There is no good nonoperative treatment of this. Open and laparoscopic techniques can be utilized but in emergency settings open surgery is sometimes necessary. Dr. Maisie Fus is the doctor week and I will discuss with her as well.  Ahley Bulls A. 06/15/2013, 5:42 AM

## 2013-06-15 NOTE — Op Note (Signed)
06/14/2013 - 06/15/2013  7:26 PM  PATIENT:  Lee Collins  67 y.o. male  Patient Care Team: Ardeth Sportsman, MD as Consulting Physician (General Surgery) Barrie Folk, MD as Consulting Physician (Gastroenterology)  PRE-OPERATIVE DIAGNOSIS:  Incarcerated paraesophogeal hernia  POST-OPERATIVE DIAGNOSIS:  Incarcerated paraesophageal hiatal hernia  PROCEDURE:   1. Laparoscopic reduction of paraesophageal hiatal hernia 2. Primary repair of hiatal hernia over pledgets. 3. Type II mediastinal dissection. 4. Anterior & posterior gastropexy. 5. Nissen fundoplication 2cm over a 56-French bougie  SURGEON:  Ardeth Sportsman, MD Lodema Pilot, DO - Assist  ANESTHESIA:   local and general  EBL:     Delay start of Pharmacological VTE agent (>24hrs) due to surgical blood loss or risk of bleeding:  no  ANESTHESIA: 1. General anesthesia. 2. Local anesthetic in a field block around all port sites.  SPECIMEN:  Mediastinal hernia sac (not sent).  DRAINS:  A 19-French Blake drain goes from the right upper quadrant along the lesser curvature of the stomach into the mediastinum.  COUNTS:  YES  PLAN OF CARE: Admit to inpatient   PATIENT DISPOSITION:  PACU - hemodynamically stable.  INDICATION:   Patient with symptomatic paraesophageal hiatal hernia.  Worsening symptoms of heartburn or reflux.  Early satiety.  Developed intractable nausea and vomiting.  Found to have gastric obstruction from organoaxial volvulus of giant paraesophageal hernia.  Improved the nasogastric tube.  Because of the urgent obstruction, I recommended urgent repair.  The anatomy & physiology of the foregut and anti-reflux mechanism was discussed.  The pathophysiology of hiatal herniation and GERD was discussed.  Natural history risks without surgery was discussed.   The patient's symptoms are not adequately controlled by medicines and other non-operative treatments.  I feel the risks of no intervention will lead to serious  problems that outweigh the operative risks; therefore, I recommended surgery to reduce the hiatal hernia out of the chest and fundoplication to rebuild the anti-reflux valve and control reflux better.  Need for a thorough workup to rule out the differential diagnosis and plan treatment was explained.  I explained laparoscopic techniques with possible need for an open approach.  Risks such as bleeding, infection, abscess, leak, need for further treatment, heart attack, death, and other risks were discussed.   I noted a good likelihood this will help address the problem.  Goals of post-operative recovery were discussed as well.  Possibility that this will not correct all symptoms was explained.  Post-operative dysphagia, need for short-term liquid & pureed diet, inability to vomit, possibility of reherniation, possible need for medicines to help control symptoms in addition to surgery were discussed.  We will work to minimize complications.   Educational handouts further explaining the pathology, treatment options, and dysphagia diet was given as well.  Questions were answered.  The patient expresses understanding & wishes to proceed with surgery.  OR FINDINGS:   Moderate-sized paraesophageal hiatal hernia with 90% of the stomach in the mediastinum.  There was a 11x 8cm hiatal defect.  It is a primary repair over pledgets.   The patient has a 2 cm Nissen fundoplication that was done over 56-French bougie.  The patient has had anterior and posterior gastropexy.  DESCRIPTION:   Informed consent was confirmed.  The patient received IV antibiotics prior to incision.  The underwent general anesthesia without difficulty.  A Foley catheter sterilely placed.  The patient was positioned in split leg with arms tucked. The abdomen was prepped and draped in the sterile  fashion.  Surgical time-out confirmed our plan.  I placed a 5 mm port in the left upper quadrant paramedian region using optical entry  technique with the patient in steep reverse Trendelenburg and left side up.  Entry was clean.  We induced carbon dioxide insufflation.  Camera inspection revealed no injury.  Under direct visualization, I placed 5 mm port in the right mid abdomen and a 10 mm port on the anterior axillary line on the left subcostal ridge.  I also placed a 5 mm port in the left subxiphoid region under direct visualization.  I removed that and placed an Omega-shaped rigid Nathanson liver retractor to lift the left lateral sector of the liver anteriorly to expose the esophageal hiatus.  This was secured to the bed using the iron man system.  I placed another 5mm port in the subxiphoid region.  We went through the hepatogastric ligament and was able to get into the anterior mediastinum between the apex of the right crus and the anterior hernia sac.  We grasped the anterior mediastinal sac at the apex of the crus.  I scored through that and got into the anterior mediastinum.  I was able to free the mediastinal sac from its attachments to the pericardium and bilateral pleura using primarily focused gentle blunt dissection as well as focused ultrasonic Harmonic dissection.  I transected phrenoesophageal attachments to the inner right crus, preserving a two centimeter cuff of mediastinal sac until I found the base of the crura.  I then came around anteriorly on the left side and freed up the phrenoesophageal attachments of the mediastinal sac on the medial part of the left crus on the superior half.  I did careful mediastinal dissection to free the mediastinal sac.  With that, we could relieve the suction cup affect of the hernia sac and help reduce the stomach back down into the abdomen, flipped back approriately.    We ligated the short gastrics along the lesser curvature of the stomach about a third way down and then came up over the fundus.  We released the attachments of the stomach to the retroperitoneum until we were able  to connect with the prior dissection on the left crus.  We completed the release of phrenoesophageal attachments to the medial part of the left crus down to its base.  With this, we had circumferential mobilization.   We placed the stomach and esophagus on axial tension.  I then did a type 2 mediastinal dissection where I freed the esophagus from its attachments to the aorta, pleura, and pericardium using primarily gentle blunt as well as focused ultrasonic dissection.  We saw the anterior vagus nerve, it was mostly intact anteriorly.  The posterior was more intact.  We preserved it at all times.  I procedded to dissect about 30 cm up into the mediastinum.  I did have a breech in the right pleural cavity and mobilizing the esophagus.   With that I could straighten out the esophagus and get 2 cm of intra-abdominal length of the esophagus at a best estimation.  I could easily stretched three more centimeters down for a total of 5 cm of intra-abdominal esophagus on gentle tension.  I freed the anterior mediastinal sac.  It was rather bulky.  We saw the anterior vagus nerve and freed the sac left of the vagus and skeletonized some medial to the vagus nerve as well.  I dissected out & removed the anterior epiphrenic pad.  And equally large posterior mediastinal  sac then we carefully skeletonized and freed off the posterior vagus nerve.  Posterior epiphrenic fat was skeletonized and freed off as well. With that, I could better define the esophagogastric junction.  I confirmed and I did have 3 cm of intra-abdominal esophageal length off tension.  I did mobilize the right crus offer its attachments to the liver and diaphragm.  I also freed some retroperitoneal attachments off the left posterior crus as well.  That allowed greater mobility area I did release the intra-abdominal carbon dioxide pressure down to 12 mm.  That allowed the for the crura to come together more easily.  I brought the fundus of the stomach  posterior to the esophagus over tothe right side.  The wrap was mobile with the classic shoe shine maneuver.  Wrap became together gently. We reflected the stomach left laterally and closed the esophageal hiatus using 0 Ethibond stitch using horizontal mattress stitches with pledgets on both sides.  I did that x3 stitches.  The crura had good substance and they came together well without any tension.  Because he was NOT a smoker, morbidly obese, Had a giant effect that took much tension to close, nor quality of his crura; I held off on doing a buttress of the repair with biologic mesh.  I reset up the and Nissen wrap and did a posterior gastropexy by taking two of #1 Ethibond stitches to the posterior part of the right side of the wrap and thru the crural closure and tied that down for good posterior gastropexy up against the hiatal closure.  I then did a left anterior gastropexy taking the apex of the left side of the wrap and tacked it to the left anterior crura just posterior to the phrenic vein. I tied that down.  This helped the wrap for completion.  Next, Dr. Eilene Ghazi, the anesthesiologist, passed a 56-French bougie transorally.  It was a lighted bougie and we did do this under axial tension.  It passed down easily without resistance.  Because of the good quality of his esophagus and no history of prior dysphagia, I then did a classic 2 cm Nissen fundoplication on the true  esophagus above the cardia using Ethibond stitch in the left side of the wrap, then anterior esophagus, and then right side of the wrap and tied that down.  I did 3 stitches.  I measured it and it was 2 cm in length.  We removed the bougie.  It was intact & without blood  The wrap was soft and floppy.  I placed a drain as noted above.  I did irrigation and ensured hemostasis.  I saw no evidence of any leak or perforation or other abnormality.  I removed the [facility] liver retractor under direct visualization.  I evacuated carbon  dioxide and removed the ports.  The skin was closed with Monocryl and sterile dressings applied.  The patient is being extubated and brought back to the recovery room.  I am about to discuss it with the patient's family.  Detailed postoperative instructions are written as well and will be given to the patient upon discharge.  Nutrition consultation has already been mader.  Esophagram swallow study ordered for the morning to evaluate the wrap.

## 2013-06-16 ENCOUNTER — Inpatient Hospital Stay (HOSPITAL_COMMUNITY): Payer: BC Managed Care – PPO

## 2013-06-16 LAB — BASIC METABOLIC PANEL
BUN: 15 mg/dL (ref 6–23)
CO2: 27 mEq/L (ref 19–32)
Chloride: 97 mEq/L (ref 96–112)
Glucose, Bld: 115 mg/dL — ABNORMAL HIGH (ref 70–99)
Potassium: 3.4 mEq/L — ABNORMAL LOW (ref 3.5–5.1)

## 2013-06-16 MED ORDER — POTASSIUM CL IN DEXTROSE 5% 20 MEQ/L IV SOLN
20.0000 meq | INTRAVENOUS | Status: DC
  Administered 2013-06-16: 20 meq via INTRAVENOUS
  Filled 2013-06-16: qty 1000

## 2013-06-16 MED ORDER — TAMSULOSIN HCL 0.4 MG PO CAPS
0.4000 mg | ORAL_CAPSULE | Freq: Every day | ORAL | Status: DC
  Administered 2013-06-16 – 2013-06-18 (×3): 0.4 mg via ORAL
  Filled 2013-06-16 (×3): qty 1

## 2013-06-16 MED ORDER — HYDROMORPHONE HCL PF 1 MG/ML IJ SOLN
0.5000 mg | INTRAMUSCULAR | Status: DC | PRN
  Administered 2013-06-16: 1 mg via INTRAVENOUS
  Filled 2013-06-16: qty 1

## 2013-06-16 MED ORDER — IOHEXOL 300 MG/ML  SOLN
50.0000 mL | Freq: Once | INTRAMUSCULAR | Status: AC | PRN
  Administered 2013-06-16: 50 mL via ORAL

## 2013-06-16 MED ORDER — KCL IN DEXTROSE-NACL 20-5-0.45 MEQ/L-%-% IV SOLN
INTRAVENOUS | Status: DC
  Administered 2013-06-16: 12:00:00 via INTRAVENOUS
  Filled 2013-06-16 (×2): qty 1000

## 2013-06-16 MED ORDER — SODIUM CHLORIDE 0.9 % IJ SOLN: 3.0000 mL | INTRAMUSCULAR | Status: DC | PRN

## 2013-06-16 MED ORDER — OXYCODONE HCL 5 MG PO TABS
5.0000 mg | ORAL_TABLET | ORAL | Status: DC | PRN
  Administered 2013-06-16 (×2): 10 mg via ORAL
  Filled 2013-06-16 (×2): qty 2

## 2013-06-16 MED ORDER — LACTATED RINGERS IV BOLUS (SEPSIS): 1000.0000 mL | Freq: Three times a day (TID) | INTRAVENOUS | Status: DC | PRN

## 2013-06-16 MED ORDER — ACETAMINOPHEN 500 MG PO TABS
1000.0000 mg | ORAL_TABLET | Freq: Three times a day (TID) | ORAL | Status: DC
  Administered 2013-06-16 – 2013-06-18 (×7): 1000 mg via ORAL
  Filled 2013-06-16 (×10): qty 2

## 2013-06-16 MED ORDER — SODIUM CHLORIDE 0.9 % IJ SOLN
3.0000 mL | Freq: Two times a day (BID) | INTRAMUSCULAR | Status: DC
  Administered 2013-06-16 – 2013-06-17 (×3): 3 mL via INTRAVENOUS

## 2013-06-16 MED ORDER — SODIUM CHLORIDE 0.9 % IJ SOLN
3.0000 mL | Freq: Two times a day (BID) | INTRAMUSCULAR | Status: DC
  Administered 2013-06-16: 3 mL via INTRAVENOUS

## 2013-06-16 NOTE — Progress Notes (Signed)
1 Day Post-Op  Subjective: Complains of lower abdominal pain but otherwise not much discomfort.  No breathing problems.  Had urinary retention last night and catheter replaced.  Has had bloody output and decreased output since catheter replaced with some blood leaking from around the meatus  Objective: Vital signs in last 24 hours: Temp:  [97.4 F (36.3 C)-99 F (37.2 C)] 98.5 F (36.9 C) (08/30 0551) Pulse Rate:  [74-106] 77 (08/30 0551) Resp:  [8-20] 18 (08/30 0551) BP: (117-172)/(60-91) 156/83 mmHg (08/30 0551) SpO2:  [95 %-100 %] 97 % (08/30 0551) Last BM Date: 06/13/13  Intake/Output from previous day: 08/29 0701 - 08/30 0700 In: 1703.3 [P.O.:80; I.V.:1623.3] Out: 1640 [Urine:1615; Drains:25] Intake/Output this shift:    General appearance: alert, cooperative and no distress Resp: nonlabored Cardio: normal rate, regular GI: soft, moderate suprapubic tenderness, no significant upper abdominal tenderness, ND, wounds without infection, JP ss Male genitalia: catheter in place with thin bloody output but not that much in bag,  he has small amount of bloody leakage from meatus Extremities: SCD's bilat  Lab Results:   Recent Labs  06/14/13 2310  WBC 12.8*  HGB 16.6  HCT 46.8  PLT 325   BMET  Recent Labs  06/14/13 2310  NA 139  K 3.6  CL 100  CO2 29  GLUCOSE 145*  BUN 18  CREATININE 0.85  CALCIUM 9.6   PT/INR No results found for this basename: LABPROT, INR,  in the last 72 hours ABG No results found for this basename: PHART, PCO2, PO2, HCO3,  in the last 72 hours  Studies/Results: Dg Chest 1 View  06/15/2013   *RADIOLOGY REPORT*  Clinical Data: Emesis, hiatal hernia  CHEST - 1 VIEW  Comparison: 06/15/2013  Findings: Increased gas and decreased fluid within the large hernia though the degree of distension is similar.  The NG tube  descends straight into the subdiaphragmatic level/left upper quadrant, raising possibility that the hernia is paresophageal.   IMPRESSION: NG tube tip over the left upper quadrant.  Findings as above.   Original Report Authenticated By: Jearld Lesch, M.D.   Ct Abdomen Pelvis W Contrast  06/15/2013   *RADIOLOGY REPORT*  Clinical Data: Emesis, hiatal hernia.  CT ABDOMEN AND PELVIS WITH CONTRAST  Technique:  Multidetector CT imaging of the abdomen and pelvis was performed following the standard protocol during bolus administration of intravenous contrast.  Contrast: OMNIPAQUE IOHEXOL 300 MG/ML  SOLN  Comparison: 06/15/2013 radiographs  Findings: Mild left lung base opacity, favor atelectasis.  Normal heart size.  Coronary artery calcifications.  Intrathoracic stomach with organoaxial rotation.  Narrowing of the gastric outlet due to the hernia with no contrast reaching the duodenum.  NG tube descends into the stomach. No pneumatosis.  A couple subcentimeter hepatic hypodensities are nonspecific however favored to reflect biliary cyst or hamartoma.  Hypodensity adjacent to the falciform, favored to reflect focal fat or variant perfusion.  Partially contracted gallbladder with mild wall thickening, nonspecific.  No biliary ductal dilatation.  No radiodense gallstones.  Unremarkable spleen, pancreas, adrenal glands.  Subcentimeter hypodensity right kidney.  Otherwise, symmetric renal enhancement.  No hydroureteronephrosis.  No CT evidence for colitis.  Appendix not identified.  No right lower quadrant inflammation.  Small bowel loops are decompressed and normal in course.  No free intraperitoneal air or fluid.  No lymphadenopathy.  Thin-walled bladder.  Marked prostatomegaly with heterogeneous/nodular enhancement.  Tiny fat containing left umbilical hernia.  Normal caliber aorta and branch vessels with mild scattered  atherosclerotic disease.  No acute osseous finding.  Mild multilevel degenerative change.  IMPRESSION: Findings are most in keeping with obstructing organoaxial gastric volvulus. Recommend surgical consultation.  Marked  prostatomegaly with heterogeneous/nodular enhancement. Recommend non emergent workup to include PSA and digital rectal exam.  Discussed via telephone with Dr. Patria Mane at 03:50 a.m. on 06/15/2013.   Original Report Authenticated By: Jearld Lesch, M.D.   Dg Abd Acute W/chest  06/15/2013   *RADIOLOGY REPORT*  Clinical Data: Upper abdominal pain  ACUTE ABDOMEN SERIES (ABDOMEN 2 VIEW & CHEST 1 VIEW)  Comparison: None.  Findings: Lungs predominately clear.  Large hiatal hernia. Cardiomediastinal contours otherwise within normal range.  No free intraperitoneal air.  Large stool burden.  Relative paucity of small bowel gas.  Mild curvature of the spine.  Osteopenia.  IMPRESSION: Large hiatal hernia and a paucity of small bowel gas, can be seen with fluid filled or decompressed loops. Recommend NG tube decompression and cross-sectional imaging if clinically concerned for acute gastric obstruction or volvulus.  Large stool burden.   Original Report Authenticated By: Jearld Lesch, M.D.    Anti-infectives: Anti-infectives   Start     Dose/Rate Route Frequency Ordered Stop   06/15/13 0926  gentamicin (GARAMYCIN) 340 mg in dextrose 5 % 100 mL IVPB     340 mg 217 mL/hr over 30 Minutes Intravenous 60 min pre-op 06/15/13 0927 06/15/13 1610   06/15/13 0900  clindamycin (CLEOCIN) IVPB 600 mg  Status:  Discontinued    Comments:  Pharmacy may adjust dosing strength, interval, or rate of medication as needed for optimal therapy for the patient Send with patient on call to the OR.  Anesthesia to complete antibiotic administration <81min prior to incision per [facility].   600 mg 100 mL/hr over 30 Minutes Intravenous On call to O.R. 06/15/13 0454 06/15/13 0858   06/15/13 0900  gentamicin (GARAMYCIN) IVPB 100 mg  Status:  Discontinued    Comments:  Anesthesia and/or Pharmacy may adjust dosing strength, schedule, rate of infusion, etc as needed to optimize therapy Send with patient on call to the OR.  Anesthesia  to complete antibiotic administration <26min prior to incision per [facility].   100 mg 200 mL/hr over 30 Minutes Intravenous On call to O.R. 06/15/13 0981 06/15/13 0926   06/15/13 0857  clindamycin (CLEOCIN) IVPB 900 mg     900 mg 100 mL/hr over 30 Minutes Intravenous 60 min pre-op 06/15/13 0858 06/15/13 1615      Assessment/Plan: s/p Procedure(s): LAPAROSCOPIC PARAESOPHAGEAL HERNIA   (N/A) He seems to be doing great from his [facility] repair.  Will check UGI and if okay, will start liquids.  His main issue has been urinary retention and hematuria.  I discussed this with the urologist and we will try removing catheter and see if he can void.  If unable to void with flomax then will replace the foley with coude catheter. The hematuria is likely foley trauma.  Will check BMP as well for kidney function.  LOS: 2 days    Lodema Pilot DAVID 06/16/2013

## 2013-06-16 NOTE — Consult Note (Signed)
Consult:  Gross hematuria, urinary retention  History of Present Illness: Pt POD#1 -- 1. Laparoscopic reduction of paraesophageal hiatal hernia  2. Primary repair of hiatal hernia over pledgets.  3. Type II mediastinal dissection.  4. Anterior & posterior gastropexy.  5. Nissen fundoplication 2cm over a 56-French bougie  Pt developed retention last night and a 14 Fr foley placed which did not drain well. Dr. Biagio Quint called me this AM with report bladder scan 125 ml with foley in place, blood around meatus. I instructed to remove foley and if pt developed retention he would need something like an 18Fr coude placed. Pt could not void and a 14 Fr placed then a 20 Fr foley.   Pt is a pt of mine with h/o BPH, 125 g prostate, elevated PSA, gross hematuria (former [facility] pt) who is on surveillance. Tried Avodart before but does not want to take meds.     History reviewed. No pertinent past medical history. Past Surgical History  Procedure Laterality Date  . Appendectomy    . Left elbow surgery    . Vocal cord surgery    . Right rotator cuff repair    . Tonsillectomy      Home Medications:  Prescriptions prior to admission  Medication Sig Dispense Refill  . aluminum & magnesium hydroxide-simethicone (MYLANTA) 500-450-40 MG/5ML suspension Take by mouth every 6 (six) hours as needed for indigestion.      Marland Kitchen aspirin 325 MG tablet Take 325 mg by mouth daily.      . calcium carbonate (TUMS - DOSED IN MG ELEMENTAL CALCIUM) 500 MG chewable tablet Chew 1 tablet by mouth daily.      . famotidine (PEPCID) 20 MG tablet Take 20 mg by mouth 2 (two) times daily.      Marland Kitchen ibuprofen (ADVIL,MOTRIN) 200 MG tablet Take 200 mg by mouth every 6 (six) hours as needed for pain.       Allergies:  Allergies  Allergen Reactions  . Penicillins   . Betadine [Povidone Iodine] Rash    History reviewed. No pertinent family history. Social History:  reports that he has never smoked. He has never used smokeless  tobacco. He reports that he does not drink alcohol or use illicit drugs.  ROS: A complete review of systems was performed.  All systems are negative except for pertinent findings as noted. @ROS @   Physical Exam:  Vital signs in last 24 hours: Temp:  [97.4 F (36.3 C)-98.7 F (37.1 C)] 98.5 F (36.9 C) (08/30 1437) Pulse Rate:  [74-106] 96 (08/30 1437) Resp:  [8-18] 18 (08/30 1437) BP: (117-172)/(60-98) 154/98 mmHg (08/30 1437) SpO2:  [95 %-100 %] 98 % (08/30 1437) General:  Alert and oriented, No acute distress HEENT: Normocephalic, atraumatic Neck: No JVD or lymphadenopathy Cardiovascular: Regular rate and rhythm Lungs: Regular rate and effort Abdomen: Soft, nontender, no abdominal masses, bladder distended Back: No CVA tenderness Extremities: No edema Neurologic: Grossly intact GU: 20Fr foley in place.  Procedure - 20 Fr foley will not irrigate. Catheter is not mobile. Deflated balloon and it will not advance through prostate, removed. Penis prepped and 22Fr hematuria, coude catheter placed. Drained 1 L mostly clear urine. Irrigated a liter with no clots, equal return. Did not feel he needed CBI. Bladder irrigated normally.   Laboratory Data:  Results for orders placed during the hospital encounter of 06/14/13 (from the past 24 hour(s))  BASIC METABOLIC PANEL     Status: Abnormal   Collection Time  06/16/13 11:10 AM      Result Value Range   Sodium 131 (*) 135 - 145 mEq/L   Potassium 3.4 (*) 3.5 - 5.1 mEq/L   Chloride 97  96 - 112 mEq/L   CO2 27  19 - 32 mEq/L   Glucose, Bld 115 (*) 70 - 99 mg/dL   BUN 15  6 - 23 mg/dL   Creatinine, Ser 1.61  0.50 - 1.35 mg/dL   Calcium 9.0  8.4 - 09.6 mg/dL   GFR calc non Af Amer 86 (*) >90 mL/min   GFR calc Af Amer >90  >90 mL/min   No results found for this or any previous visit (from the past 240 hour(s)). Creatinine:  Recent Labs  06/14/13 2310 06/16/13 1110  CREATININE 0.85 0.92    Impression/Assessment/Plan: Gross  hematuria Urinary retention   From BPH, large prostate and foley not reaching bladder -- continue tamsulosin and foley. He can return to my office next week for void trial.    Antony Haste 06/16/2013, 5:29 PM

## 2013-06-17 MED ORDER — TAMSULOSIN HCL 0.4 MG PO CAPS: 0.4000 mg | ORAL_CAPSULE | Freq: Every day | ORAL | Status: DC

## 2013-06-17 NOTE — Progress Notes (Signed)
2 Days Post-Op  Subjective: Feeling much better today.  Catheter placed by urology last night.  Tolerating clears  Objective: Vital signs in last 24 hours: Temp:  [98 F (36.7 C)-98.7 F (37.1 C)] 98.7 F (37.1 C) (08/31 0600) Pulse Rate:  [76-96] 76 (08/31 0600) Resp:  [16-18] 16 (08/31 0600) BP: (110-154)/(64-98) 153/76 mmHg (08/31 0600) SpO2:  [95 %-98 %] 95 % (08/31 0600) Last BM Date: 06/13/13  Intake/Output from previous day: 08/30 0701 - 08/31 0700 In: 1072.5 [P.O.:480; I.V.:592.5] Out: 3262 [Urine:3250; Drains:12] Intake/Output this shift: Total I/O In: 240 [P.O.:240] Out: 504 [Urine:500; Drains:4]  General appearance: alert, cooperative and no distress Resp: clear to auscultation bilaterally Cardio: normal rate, regular GI: soft, mild tenderness, ND, wounds okay. JP ss Urine clearing   Lab Results:   Recent Labs  06/14/13 2310  WBC 12.8*  HGB 16.6  HCT 46.8  PLT 325   BMET  Recent Labs  06/14/13 2310 06/16/13 1110  NA 139 131*  K 3.6 3.4*  CL 100 97  CO2 29 27  GLUCOSE 145* 115*  BUN 18 15  CREATININE 0.85 0.92  CALCIUM 9.6 9.0   PT/INR No results found for this basename: LABPROT, INR,  in the last 72 hours ABG No results found for this basename: PHART, PCO2, PO2, HCO3,  in the last 72 hours  Studies/Results: Dg Esophagus W/water Sol Cm  06/16/2013   *RADIOLOGY REPORT*  Clinical Data:1 day postop para esophageal hernia repair.  Rule out leak or obstruction  ESOPHAGUS/BARIUM SWALLOW/TABLET STUDY  Fluoroscopy Time: 1-minute-6-second  Comparison:   CT 06/15/2013  Findings: Postop hiatal hernia repair with Nissen fundoplication. There is a surgical drain within the mediastinum around the distal esophagus.  Satisfactory hiatal hernia reduction and repair.  There is edema at the gastroesophageal junction.  There is a thin patent lumen which partially drains the esophagus.  With the patient standing, contrast remained in the esophagus and was slow to  drain into the stomach suggesting edema at the repair site.  No leak.  The stomach is decompressed and is within the abdominal cavity.  IMPRESSION: Satisfactory hiatal hernia repair.  There is no leak.  There is edema at the GE junction with narrowing of the lumen.  Esophagus was slow to empty into the stomach.   Original Report Authenticated By: Janeece Riggers, M.D.    Anti-infectives: Anti-infectives   Start     Dose/Rate Route Frequency Ordered Stop   06/15/13 0926  gentamicin (GARAMYCIN) 340 mg in dextrose 5 % 100 mL IVPB     340 mg 217 mL/hr over 30 Minutes Intravenous 60 min pre-op 06/15/13 0927 06/15/13 1610   06/15/13 0900  clindamycin (CLEOCIN) IVPB 600 mg  Status:  Discontinued    Comments:  Pharmacy may adjust dosing strength, interval, or rate of medication as needed for optimal therapy for the patient Send with patient on call to the OR.  Anesthesia to complete antibiotic administration <56min prior to incision per [facility].   600 mg 100 mL/hr over 30 Minutes Intravenous On call to O.R. 06/15/13 9604 06/15/13 0858   06/15/13 0900  gentamicin (GARAMYCIN) IVPB 100 mg  Status:  Discontinued    Comments:  Anesthesia and/or Pharmacy may adjust dosing strength, schedule, rate of infusion, etc as needed to optimize therapy Send with patient on call to the OR.  Anesthesia to complete antibiotic administration <72min prior to incision per [facility].   100 mg 200 mL/hr over 30 Minutes Intravenous On call  to O.R. 06/15/13 1610 06/15/13 0926   06/15/13 0857  clindamycin (CLEOCIN) IVPB 900 mg     900 mg 100 mL/hr over 30 Minutes Intravenous 60 min pre-op 06/15/13 0858 06/15/13 1615      Assessment/Plan: s/p Procedure(s): LAPAROSCOPIC PARAESOPHAGEAL HERNIA   (N/A) full liquid diet.  continue foley.  will need at discharge.  plan for discharge tomorrow if no further urinary issues.  doing okay from [facility] repair  LOS: 3 days    Lodema Pilot DAVID 06/17/2013

## 2013-06-17 NOTE — Progress Notes (Signed)
2 Days Post-Op Subjective: Patient reports feeling better since catheter was placed. He passed a clot to his catheter earlier this morning, and this had bloody urine the past few hours.  Objective: Vital signs in last 24 hours: Temp:  [98 F (36.7 C)-98.7 F (37.1 C)] 98.7 F (37.1 C) (08/31 0600) Pulse Rate:  [76-96] 76 (08/31 0600) Resp:  [16-18] 16 (08/31 0600) BP: (110-154)/(64-98) 153/76 mmHg (08/31 0600) SpO2:  [95 %-98 %] 95 % (08/31 0600)  Intake/Output from previous day: 08/30 0701 - 08/31 0700 In: 1072.5 [P.O.:480; I.V.:592.5] Out: 3262 [Urine:3250; Drains:12] Intake/Output this shift:    Physical Exam:  Constitutional: Vital signs reviewed. WD WN in NAD   Eyes: PERRL, No scleral icterus.   Pulmonary/Chest: Normal effort Abdominal: Soft. Non-tender, non-distended, bowel sounds are normal, no masses, organomegaly, or guarding present.  Genitourinary: Catheter is appropriately placed. Bloody urine is draining with a few small clots.   Lab Results:  Recent Labs  06/14/13 2310  HGB 16.6  HCT 46.8   BMET  Recent Labs  06/14/13 2310 06/16/13 1110  NA 139 131*  K 3.6 3.4*  CL 100 97  CO2 29 27  GLUCOSE 145* 115*  BUN 18 15  CREATININE 0.85 0.92  CALCIUM 9.6 9.0   No results found for this basename: LABPT, INR,  in the last 72 hours No results found for this basename: LABURIN,  in the last 72 hours No results found for this or any previous visit.  Studies/Results: Dg Esophagus W/water Sol Cm  06/16/2013   *RADIOLOGY REPORT*  Clinical Data:1 day postop para esophageal hernia repair.  Rule out leak or obstruction  ESOPHAGUS/BARIUM SWALLOW/TABLET STUDY  Fluoroscopy Time: 1-minute-6-second  Comparison:   CT 06/15/2013  Findings: Postop hiatal hernia repair with Nissen fundoplication. There is a surgical drain within the mediastinum around the distal esophagus.  Satisfactory hiatal hernia reduction and repair.  There is edema at the gastroesophageal junction.   There is a thin patent lumen which partially drains the esophagus.  With the patient standing, contrast remained in the esophagus and was slow to drain into the stomach suggesting edema at the repair site.  No leak.  The stomach is decompressed and is within the abdominal cavity.  IMPRESSION: Satisfactory hiatal hernia repair.  There is no leak.  There is edema at the GE junction with narrowing of the lumen.  Esophagus was slow to empty into the stomach.   Original Report Authenticated By: Janeece Riggers, M.D.   I irrigated the patient's catheter with approximately 500 cc of saline until clear. A few small clots were liberated. Assessment/Plan:   BPH with traumatic catheter placement intraoperatively, replaced now with a large bore catheter. He is draining appropriately.    I would stop his aspirin until his urine clears postoperatively. He is okay to go home with a catheter with tamsulosin. I will order nursing care to teach catheter management and bag changes.   LOS: 3 days   Marcine Matar M 06/17/2013, 8:10 AM

## 2013-06-18 MED ORDER — OXYCODONE HCL 5 MG/5ML PO SOLN: 5.0000 mg | ORAL | Status: DC | PRN

## 2013-06-18 MED ORDER — TAMSULOSIN HCL 0.4 MG PO CAPS: 0.4000 mg | ORAL_CAPSULE | Freq: Every day | ORAL | Status: DC

## 2013-06-18 NOTE — Progress Notes (Signed)
3 Days Post-Op  Subjective: Doing well.  No urinary issues overnight. No abdominal pain. Tolerating full liquids.  No nausea  Objective: Vital signs in last 24 hours: Temp:  [98.4 F (36.9 C)-98.7 F (37.1 C)] 98.6 F (37 C) (09/01 0612) Pulse Rate:  [73-80] 80 (09/01 0612) Resp:  [16] 16 (09/01 0612) BP: (123-127)/(71-89) 127/89 mmHg (09/01 0612) SpO2:  [95 %-98 %] 98 % (09/01 0612) Last BM Date: 06/13/13  Intake/Output from previous day: 08/31 0701 - 09/01 0700 In: 600 [P.O.:600] Out: 2961 [Urine:2950; Drains:11] Intake/Output this shift:    General appearance: alert, cooperative and no distress Resp: clear to auscultation bilaterally Cardio: normal rate, regular GI: soft, abdomen is nontender, ND, wounds okay, JP minimal ss output Male genitalia: catheter in place.  blood has cleared, normal appearing urine  Lab Results:  No results found for this basename: WBC, HGB, HCT, PLT,  in the last 72 hours BMET  Recent Labs  06/16/13 1110  NA 131*  K 3.4*  CL 97  CO2 27  GLUCOSE 115*  BUN 15  CREATININE 0.92  CALCIUM 9.0   PT/INR No results found for this basename: LABPROT, INR,  in the last 72 hours ABG No results found for this basename: PHART, PCO2, PO2, HCO3,  in the last 72 hours  Studies/Results: Dg Esophagus W/water Sol Cm  06/16/2013   *RADIOLOGY REPORT*  Clinical Data:1 day postop para esophageal hernia repair.  Rule out leak or obstruction  ESOPHAGUS/BARIUM SWALLOW/TABLET STUDY  Fluoroscopy Time: 1-minute-6-second  Comparison:   CT 06/15/2013  Findings: Postop hiatal hernia repair with Nissen fundoplication. There is a surgical drain within the mediastinum around the distal esophagus.  Satisfactory hiatal hernia reduction and repair.  There is edema at the gastroesophageal junction.  There is a thin patent lumen which partially drains the esophagus.  With the patient standing, contrast remained in the esophagus and was slow to drain into the stomach suggesting  edema at the repair site.  No leak.  The stomach is decompressed and is within the abdominal cavity.  IMPRESSION: Satisfactory hiatal hernia repair.  There is no leak.  There is edema at the GE junction with narrowing of the lumen.  Esophagus was slow to empty into the stomach.   Original Report Authenticated By: Janeece Riggers, M.D.    Anti-infectives: Anti-infectives   Start     Dose/Rate Route Frequency Ordered Stop   06/15/13 0926  gentamicin (GARAMYCIN) 340 mg in dextrose 5 % 100 mL IVPB     340 mg 217 mL/hr over 30 Minutes Intravenous 60 min pre-op 06/15/13 0927 06/15/13 1610   06/15/13 0900  clindamycin (CLEOCIN) IVPB 600 mg  Status:  Discontinued    Comments:  Pharmacy may adjust dosing strength, interval, or rate of medication as needed for optimal therapy for the patient Send with patient on call to the OR.  Anesthesia to complete antibiotic administration <62min prior to incision per [facility].   600 mg 100 mL/hr over 30 Minutes Intravenous On call to O.R. 06/15/13 0981 06/15/13 0858   06/15/13 0900  gentamicin (GARAMYCIN) IVPB 100 mg  Status:  Discontinued    Comments:  Anesthesia and/or Pharmacy may adjust dosing strength, schedule, rate of infusion, etc as needed to optimize therapy Send with patient on call to the OR.  Anesthesia to complete antibiotic administration <26min prior to incision per [facility].   100 mg 200 mL/hr over 30 Minutes Intravenous On call to O.R. 06/15/13 1914 06/15/13 7829  06/15/13 0857  clindamycin (CLEOCIN) IVPB 900 mg     900 mg 100 mL/hr over 30 Minutes Intravenous 60 min pre-op 06/15/13 0858 06/15/13 1615      Assessment/Plan: s/p Procedure(s): LAPAROSCOPIC PARAESOPHAGEAL HERNIA   (N/A) he looks and feels well. will go home with foley and JP drain.  I discussed the postop instructions with the patient and his wife.  he feels comfortable with discharge today and follow up with [facility] this week and urology this week.  LOS: 4 days    Lodema Pilot DAVID 06/18/2013

## 2013-06-18 NOTE — Progress Notes (Signed)
3 Days Post-Op Subjective: Patient reports that he has had catheter related problems over the past day. He has not had any blood in his urine by report since yesterday morning. He knows that a change to a leg bag.  Objective: Vital signs in last 24 hours: Temp:  [98.4 F (36.9 C)-98.7 F (37.1 C)] 98.6 F (37 C) (09/01 0612) Pulse Rate:  [73-80] 80 (09/01 0612) Resp:  [16] 16 (09/01 0612) BP: (123-127)/(71-89) 127/89 mmHg (09/01 0612) SpO2:  [95 %-98 %] 98 % (09/01 0612)  Intake/Output from previous day: 08/31 0701 - 09/01 0700 In: 600 [P.O.:600] Out: 2961 [Urine:2950; Drains:11] Intake/Output this shift:    Physical Exam:  Constitutional: Vital signs reviewed. WD WN in NAD    Urine is clear, without obvious blood  Lab Results: No results found for this basename: HGB, HCT,  in the last 72 hours BMET  Recent Labs  06/16/13 1110  NA 131*  K 3.4*  CL 97  CO2 27  GLUCOSE 115*  BUN 15  CREATININE 0.92  CALCIUM 9.0   No results found for this basename: LABPT, INR,  in the last 72 hours No results found for this basename: LABURIN,  in the last 72 hours No results found for this or any previous visit.  Studies/Results: Dg Esophagus W/water Sol Cm  06/16/2013   *RADIOLOGY REPORT*  Clinical Data:1 day postop para esophageal hernia repair.  Rule out leak or obstruction  ESOPHAGUS/BARIUM SWALLOW/TABLET STUDY  Fluoroscopy Time: 1-minute-6-second  Comparison:   CT 06/15/2013  Findings: Postop hiatal hernia repair with Nissen fundoplication. There is a surgical drain within the mediastinum around the distal esophagus.  Satisfactory hiatal hernia reduction and repair.  There is edema at the gastroesophageal junction.  There is a thin patent lumen which partially drains the esophagus.  With the patient standing, contrast remained in the esophagus and was slow to drain into the stomach suggesting edema at the repair site.  No leak.  The stomach is decompressed and is within the  abdominal cavity.  IMPRESSION: Satisfactory hiatal hernia repair.  There is no leak.  There is edema at the GE junction with narrowing of the lumen.  Esophagus was slow to empty into the stomach.   Original Report Authenticated By: Janeece Riggers, M.D.    Assessment/Plan:   BPH with catheter related trauma. Currently, his catheter is draining fine.    He can go home at leisure of general surgery with his catheter, with followup in our office for voiding trial. He should go home on tamsulosin.   LOS: 4 days   Marcine Matar M 06/18/2013, 8:09 AM

## 2013-06-19 ENCOUNTER — Encounter (HOSPITAL_COMMUNITY): Payer: Self-pay | Admitting: Surgery

## 2013-06-23 ENCOUNTER — Emergency Department (HOSPITAL_COMMUNITY): Payer: BC Managed Care – PPO

## 2013-06-23 ENCOUNTER — Observation Stay (HOSPITAL_COMMUNITY)
Admission: EM | Admit: 2013-06-23 | Discharge: 2013-06-24 | Disposition: A | Discharge status: Home / Self Care | Payer: BC Managed Care – PPO | Attending: Internal Medicine | Admitting: Internal Medicine

## 2013-06-23 ENCOUNTER — Encounter (HOSPITAL_COMMUNITY): Payer: Self-pay | Admitting: *Deleted

## 2013-06-23 DIAGNOSIS — N138 Other obstructive and reflux uropathy: Secondary | ICD-10-CM | POA: Insufficient documentation

## 2013-06-23 DIAGNOSIS — R079 Chest pain, unspecified: Principal | ICD-10-CM | POA: Diagnosis present

## 2013-06-23 DIAGNOSIS — K440 Diaphragmatic hernia with obstruction, without gangrene: Secondary | ICD-10-CM

## 2013-06-23 DIAGNOSIS — K3189 Other diseases of stomach and duodenum: Secondary | ICD-10-CM

## 2013-06-23 DIAGNOSIS — N401 Enlarged prostate with lower urinary tract symptoms: Secondary | ICD-10-CM | POA: Insufficient documentation

## 2013-06-23 DIAGNOSIS — R339 Retention of urine, unspecified: Secondary | ICD-10-CM | POA: Insufficient documentation

## 2013-06-23 LAB — CBC
HCT: 39.6 % (ref 39.0–52.0)
Hemoglobin: 14.3 g/dL (ref 13.0–17.0)
MCHC: 36.1 g/dL — ABNORMAL HIGH (ref 30.0–36.0)
RBC: 4.22 MIL/uL (ref 4.22–5.81)

## 2013-06-23 LAB — COMPREHENSIVE METABOLIC PANEL
ALT: 159 U/L — ABNORMAL HIGH (ref 0–53)
Alkaline Phosphatase: 56 U/L (ref 39–117)
BUN: 16 mg/dL (ref 6–23)
CO2: 26 mEq/L (ref 19–32)
GFR calc Af Amer: >90 mL/min (ref >90)
GFR calc non Af Amer: >90 mL/min (ref >90)
Glucose, Bld: 97 mg/dL (ref 70–99)
Potassium: 3.6 mEq/L (ref 3.5–5.1)
Sodium: 134 mEq/L — ABNORMAL LOW (ref 135–145)
Total Bilirubin: 0.5 mg/dL (ref 0.3–1.2)
Total Protein: 7.1 g/dL (ref 6.0–8.3)

## 2013-06-23 LAB — APTT: aPTT: 32 seconds (ref 24–37)

## 2013-06-23 LAB — TROPONIN I: Troponin I: <0.3 ng/mL (ref <0.30)

## 2013-06-23 LAB — POCT I-STAT TROPONIN I: Troponin i, poc: 0 ng/mL (ref 0.00–0.08)

## 2013-06-23 MED ORDER — SODIUM CHLORIDE 0.9 % IV BOLUS (SEPSIS)
250.0000 mL | Freq: Once | INTRAVENOUS | Status: AC
  Administered 2013-06-23: 250 mL via INTRAVENOUS

## 2013-06-23 MED ORDER — IOHEXOL 350 MG/ML SOLN
100.0000 mL | Freq: Once | INTRAVENOUS | Status: AC | PRN
  Administered 2013-06-23: 100 mL via INTRAVENOUS

## 2013-06-23 MED ORDER — MORPHINE SULFATE 2 MG/ML IJ SOLN
2.0000 mg | Freq: Once | INTRAMUSCULAR | Status: DC
  Filled 2013-06-23: qty 1

## 2013-06-23 MED ORDER — SODIUM CHLORIDE 0.9 % IJ SOLN
3.0000 mL | Freq: Two times a day (BID) | INTRAMUSCULAR | Status: DC
  Administered 2013-06-23: 3 mL via INTRAVENOUS

## 2013-06-23 MED ORDER — SODIUM CHLORIDE 0.9 % IV SOLN
INTRAVENOUS | Status: DC
  Administered 2013-06-23 (×2): via INTRAVENOUS

## 2013-06-23 MED ORDER — ONDANSETRON HCL 4 MG/2ML IJ SOLN
4.0000 mg | Freq: Once | INTRAMUSCULAR | Status: DC
  Filled 2013-06-23: qty 2

## 2013-06-23 MED ORDER — HEPARIN SODIUM (PORCINE) 5000 UNIT/ML IJ SOLN
5000.0000 [IU] | Freq: Three times a day (TID) | INTRAMUSCULAR | Status: DC
  Administered 2013-06-23 – 2013-06-24 (×2): 5000 [IU] via SUBCUTANEOUS
  Filled 2013-06-23 (×5): qty 1

## 2013-06-23 NOTE — H&P (Signed)
[facility] History and Physical  Thoren Hosang WUJ:[id number] DOB: [birth year] DOA: 06/23/2013  Referring physician: ED PCP: No primary provider on file.   Chief Complaint: Chest pain  HPI: Lee Collins is a 67 y.o. male s/p Nissen Fundoplication last Friday who presents to the ED with SOB and anterior chest pain this morning.  Pain was worse with deep inspiration, also has some pain in L shoulder.  He has been having some dizziness since discharge but wonders if this is due to the increase in the dose of flomax they put him on after they removed his urinary catheter.  EDP requests the patient be admitted as CP r/o cardiac.  Review of Systems: 12 systems reviewed and otherwise negative.  Past Medical History  Diagnosis Date  . Hiatal hernia    Past Surgical History  Procedure Laterality Date  . Appendectomy    . Left elbow surgery    . Vocal cord surgery    . Right rotator cuff repair    . Tonsillectomy    . Laparoscopic nissen fundoplication N/A 06/15/2013    Procedure: LAPAROSCOPIC PARAESOPHAGEAL HERNIA  ;  Surgeon: Ardeth Sportsman, MD;  Location: [facility];  Service: General;  Laterality: N/A;   Social History:  reports that he has never smoked. He has never used smokeless tobacco. He reports that he does not drink alcohol or use illicit drugs.   Allergies  Allergen Reactions  . Betadine [Povidone Iodine] Rash  . Penicillins Rash    No family history on file.  Prior to Admission medications   Medication Sig Start Date End Date Taking? Authorizing Provider  aspirin EC 81 MG tablet Take 162 mg by mouth daily.   Yes Historical Provider, MD  ondansetron (ZOFRAN) 4 MG tablet Take 1 tablet (4 mg total) by mouth every 8 (eight) hours as needed for nausea. 06/15/13  Yes Ardeth Sportsman, MD  promethazine (PHENERGAN) 25 MG suppository Place 1 suppository (25 mg total) rectally every 6 (six) hours as needed for nausea (Use if cannot keep anything down by mouth.). 06/15/13  Yes Ardeth Sportsman, MD  tamsulosin (FLOMAX) 0.4 MG CAPS capsule Take 0.8 mg by mouth daily.   Yes Historical Provider, MD   Physical Exam: Filed Vitals:   06/23/13 1600  BP:   Pulse:   Temp:   Resp: 13    General:  NAD, resting comfortably in bed Eyes: PEERLA EOMI ENT: mucous membranes moist Neck: supple w/o JVD Cardiovascular: RRR w/o MRG Respiratory: CTA B Abdomen: soft, nt, nd, bs+ Skin: no rash nor lesion Musculoskeletal: MAE, full ROM all 4 extremities Psychiatric: normal tone and affect Neurologic: AAOx3, grossly non-focal  Labs on Admission:  Basic Metabolic Panel:  Recent Labs Lab 06/23/13 1454  NA 134*  K 3.6  CL 98  CO2 26  GLUCOSE 97  BUN 16  CREATININE 0.77  CALCIUM 9.1   Liver Function Tests:  Recent Labs Lab 06/23/13 1454  AST 55*  ALT 159*  ALKPHOS 56  BILITOT 0.5  PROT 7.1  ALBUMIN 3.2*    Recent Labs Lab 06/23/13 1454  LIPASE 17   No results found for this basename: AMMONIA,  in the last 168 hours CBC:  Recent Labs Lab 06/23/13 1454  WBC 7.8  HGB 14.3  HCT 39.6  MCV 93.8  PLT 372   Cardiac Enzymes:  Recent Labs Lab 06/23/13 1815  TROPONINI <0.30    BNP (last 3 results) No results found for this basename: PROBNP,  in the last 8760 hours CBG: No results found for this basename: GLUCAP,  in the last 168 hours  Radiological Exams on Admission: Dg Chest 2 View  06/23/2013   *RADIOLOGY REPORT*  Clinical Data: Chest pain and shortness of breath.  CHEST - 2 VIEW  Comparison: Chest x-ray 06/15/2013.  Findings: Lung volumes are normal.  No consolidative airspace disease.  No pleural effusions.  No pneumothorax.  No pulmonary nodule or mass noted.  Pulmonary vasculature and the cardiomediastinal silhouette are within normal limits. Atherosclerosis in the thoracic aorta.  Previously noted massive hiatal hernia is no longer identified.  IMPRESSION: 1. No radiographic evidence of acute cardiopulmonary disease. 2.  Atherosclerosis.   Original  Report Authenticated By: Trudie Reed, M.D.   Ct Angio Chest Pe W/cm &/or Wo Cm  06/23/2013   *RADIOLOGY REPORT*  Clinical Data: Chest pain, shortness of breath, hard debris, pain in chest and back when taking deep breath  CT ANGIOGRAPHY CHEST  Technique:  Multidetector CT imaging of the chest using the standard protocol during bolus administration of intravenous contrast. Multiplanar reconstructed images including MIPs were obtained and reviewed to evaluate the vascular anatomy.  Contrast: OMNIPAQUE IOHEXOL 350 MG/ML SOLN pain  Comparison: None.  Findings: Atherosclerotic calcifications of the coronary arteries and less in the thoracic aorta. No aortic aneurysm or dissection. Mild gaseous distention of the distal thoracic esophagus with question mild wall thickening. Large soft tissue defect is identified at the gastroesophageal junction likely representing Nissen fundoplication though this makes it difficult to exclude mass. No thoracic adenopathy.  Remaining visualized portions of upper abdomen unremarkable. Pulmonary arteries patent. No evidence of pulmonary embolism. Dependent atelectasis and right lower lobe. Remaining lungs clear. No infiltrate, pleural effusion or pneumothorax. No acute osseous findings.  IMPRESSION: No evidence of pulmonary embolism. Probable Nissen fundoplication accounting for density at the gastroesophageal junction. Question mild diffuse wall thickening of the distal thoracic esophagus, nonspecific, can be seen with reflux disease and infection.   Original Report Authenticated By: Ulyses Southward, M.D.    EKG: Independently reviewed.  Assessment/Plan Principal Problem:   Chest pain   1. Chest pain - HEART score 2 and the 2 points he does get is simply due to age.  Patient very active at baseline and still runs daily.  No other cardiac risk factors.  Most likely his chest pain is GI related and pleuritic due to his Nissen Fundoplication that he had performed just last  Friday.  EDP still feels he needs admission for CP obs, admitting patient to observation, troponins ordered, NPO after midnight but I doubt that cardiology will want to do a stress test inpatient on him.    Code Status: Full Code (must indicate code status--if unknown or must be presumed, indicate so) Family Communication: Spoke with wife at bedside (indicate person spoken with, if applicable, with phone number if by telephone) Disposition Plan: Admit to obs (indicate anticipated LOS)  Time spent: 50 min  Aireona Torelli M. [facility] Pager [phone]  If 7PM-7AM, please contact night-coverage [email or web address] Password [email or web address] 06/23/2013, 7:40 PM

## 2013-06-23 NOTE — ED Notes (Signed)
MD at bedside. 

## 2013-06-23 NOTE — ED Notes (Signed)
Puree diet ordered a this time. Okay per ED MD

## 2013-06-23 NOTE — ED Notes (Signed)
Pt given meal tray.  Ate 100%.  Pt now being transported upstairs by tech.

## 2013-06-23 NOTE — ED Notes (Signed)
Report given to Kary Kos on 3 west.

## 2013-06-23 NOTE — ED Notes (Signed)
Pt with hiatal hernia repair last Fri to ED c/o sob and anterior chest pain since this am.  States pain increases with inspiration.  Pt states the pain does not radiate to L arm and shoulder, but that they do hurt.  PT also c/o dizziness that is improving.  He is taking flomax and is not sure if the dizziness that is causing dizziness.

## 2013-06-23 NOTE — ED Provider Notes (Signed)
CSN: [id number]     Arrival date & time 06/23/13  1401 History   First MD Initiated Contact with Patient 06/23/13 1441     Chief Complaint  Patient presents with  . Chest Pain  . Shortness of Breath    post surgery   (Consider location/radiation/quality/duration/timing/severity/associated sxs/prior Treatment) The history is provided by the patient.   67 year old male status post a gastric bubble is repair by Dr. gross [facility] [place] [facility] patient was admitted for this on August 28 and was just discharged on Labor Day. Patient this morning felt fine when he got up she felt quite good and at 9:00 had sudden onset of chest tightness anterior part of the chest left greater than right 4-5/10 associated with shortness of breath and a little worse with taking deep breath no nausea no vomiting patient did take aspirin at home for this. Chest pain described as tightness that radiates some to the back and a little bit to the left arm. Patient never had anything like this before not known to have a cardiac history. Denies any leg swelling or pain in the legs.  Past Medical History  Diagnosis Date  . Hiatal hernia    Past Surgical History  Procedure Laterality Date  . Appendectomy    . Left elbow surgery    . Vocal cord surgery    . Right rotator cuff repair    . Tonsillectomy    . Laparoscopic nissen fundoplication N/A 06/15/2013    Procedure: LAPAROSCOPIC PARAESOPHAGEAL HERNIA  ;  Surgeon: Ardeth Sportsman, MD;  Location: [facility];  Service: General;  Laterality: N/A;   No family history on file. History  Substance Use Topics  . Smoking status: Never Smoker   . Smokeless tobacco: Never Used  . Alcohol Use: No    Review of Systems  Constitutional: Positive for fatigue. Negative for fever.  HENT: Positive for congestion.   Eyes: Negative for redness and visual disturbance.  Respiratory: Positive for shortness of breath.   Cardiovascular: Positive for chest pain.  Gastrointestinal:  Negative for nausea, vomiting and abdominal pain.  Genitourinary: Negative for dysuria.  Musculoskeletal: Positive for back pain.  Skin: Negative for rash.  Neurological: Negative for headaches.  Hematological: Does not bruise/bleed easily.  Psychiatric/Behavioral: Negative for confusion.    Allergies  Betadine and Penicillins  Home Medications   Current Outpatient Rx  Name  Route  Sig  Dispense  Refill  . aspirin EC 81 MG tablet   Oral   Take 162 mg by mouth daily.         . ondansetron (ZOFRAN) 4 MG tablet   Oral   Take 1 tablet (4 mg total) by mouth every 8 (eight) hours as needed for nausea.   15 tablet   2   . promethazine (PHENERGAN) 25 MG suppository   Rectal   Place 1 suppository (25 mg total) rectally every 6 (six) hours as needed for nausea (Use if cannot keep anything down by mouth.).   3 suppository   3   . tamsulosin (FLOMAX) 0.4 MG CAPS capsule   Oral   Take 0.8 mg by mouth daily.          BP 121/69  Pulse 64  Temp(Src) 98.6 F (37 C) (Oral)  Resp 13  Ht 5' 8.5" (1.74 m)  Wt 153 lb (69.4 kg)  BMI 22.92 kg/m2  SpO2 100% Physical Exam  Nursing note and vitals reviewed. Constitutional: He is oriented to person, place, and time.  He appears well-developed and well-nourished. No distress.  HENT:  Head: Normocephalic and atraumatic.  Mouth/Throat: Oropharynx is clear and moist.  Eyes: Conjunctivae and EOM are normal. Pupils are equal, round, and reactive to light.  Neck: Normal range of motion.  Cardiovascular: Normal rate and regular rhythm.   No murmur heard. Pulmonary/Chest: Effort normal. No respiratory distress. He has no wheezes. He has no rales.  Abdominal: Soft. Bowel sounds are normal. There is no tenderness.  Musculoskeletal: Normal range of motion. He exhibits no edema and no tenderness.  Neurological: He is alert and oriented to person, place, and time. No cranial nerve deficit. He exhibits normal muscle tone. Coordination normal.   Skin: Skin is warm. No rash noted.    ED Course  Procedures (including critical care time) Labs Review Labs Reviewed  CBC - Abnormal; Notable for the following:    MCHC 36.1 (*)    All other components within normal limits  COMPREHENSIVE METABOLIC PANEL - Abnormal; Notable for the following:    Sodium 134 (*)    Albumin 3.2 (*)    AST 55 (*)    ALT 159 (*)    All other components within normal limits  APTT  LIPASE, BLOOD  TROPONIN I   Results for orders placed during the hospital encounter of 06/23/13  CBC      Result Value Range   WBC 7.8  4.0 - 10.5 K/uL   RBC 4.22  4.22 - 5.81 MIL/uL   Hemoglobin 14.3  13.0 - 17.0 g/dL   HCT 40.9  81.1 - 91.4 %   MCV 93.8  78.0 - 100.0 fL   MCH 33.9  26.0 - 34.0 pg   MCHC 36.1 (*) 30.0 - 36.0 g/dL   RDW 78.2  95.6 - 21.3 %   Platelets 372  150 - 400 K/uL  COMPREHENSIVE METABOLIC PANEL      Result Value Range   Sodium 134 (*) 135 - 145 mEq/L   Potassium 3.6  3.5 - 5.1 mEq/L   Chloride 98  96 - 112 mEq/L   CO2 26  19 - 32 mEq/L   Glucose, Bld 97  70 - 99 mg/dL   BUN 16  6 - 23 mg/dL   Creatinine, Ser 0.86  0.50 - 1.35 mg/dL   Calcium 9.1  8.4 - 57.8 mg/dL   Total Protein 7.1  6.0 - 8.3 g/dL   Albumin 3.2 (*) 3.5 - 5.2 g/dL   AST 55 (*) 0 - 37 U/L   ALT 159 (*) 0 - 53 U/L   Alkaline Phosphatase 56  39 - 117 U/L   Total Bilirubin 0.5  0.3 - 1.2 mg/dL   GFR calc non Af Amer >90  >90 mL/min   GFR calc Af Amer >90  >90 mL/min  APTT      Result Value Range   aPTT 32  24 - 37 seconds  LIPASE, BLOOD      Result Value Range   Lipase 17  11 - 59 U/L    0.8 care troponin normal at 0.00   Date: 06/23/2013  Rate: 72  Rhythm: normal sinus rhythm  QRS Axis: normal  Intervals: normal  ST/T Wave abnormalities: normal  Conduction Disutrbances:none  Narrative Interpretation:   Old EKG Reviewed: none available   Imaging Review Dg Chest 2 View  06/23/2013   *RADIOLOGY REPORT*  Clinical Data: Chest pain and shortness of breath.   CHEST - 2 VIEW  Comparison: Chest x-ray 06/15/2013.  Findings: Lung volumes are normal.  No consolidative airspace disease.  No pleural effusions.  No pneumothorax.  No pulmonary nodule or mass noted.  Pulmonary vasculature and the cardiomediastinal silhouette are within normal limits. Atherosclerosis in the thoracic aorta.  Previously noted massive hiatal hernia is no longer identified.  IMPRESSION: 1. No radiographic evidence of acute cardiopulmonary disease. 2.  Atherosclerosis.   Original Report Authenticated By: Trudie Reed, M.D.   Ct Angio Chest Pe W/cm &/or Wo Cm  06/23/2013   *RADIOLOGY REPORT*  Clinical Data: Chest pain, shortness of breath, hard debris, pain in chest and back when taking deep breath  CT ANGIOGRAPHY CHEST  Technique:  Multidetector CT imaging of the chest using the standard protocol during bolus administration of intravenous contrast. Multiplanar reconstructed images including MIPs were obtained and reviewed to evaluate the vascular anatomy.  Contrast: OMNIPAQUE IOHEXOL 350 MG/ML SOLN pain  Comparison: None.  Findings: Atherosclerotic calcifications of the coronary arteries and less in the thoracic aorta. No aortic aneurysm or dissection. Mild gaseous distention of the distal thoracic esophagus with question mild wall thickening. Large soft tissue defect is identified at the gastroesophageal junction likely representing Nissen fundoplication though this makes it difficult to exclude mass. No thoracic adenopathy.  Remaining visualized portions of upper abdomen unremarkable. Pulmonary arteries patent. No evidence of pulmonary embolism. Dependent atelectasis and right lower lobe. Remaining lungs clear. No infiltrate, pleural effusion or pneumothorax. No acute osseous findings.  IMPRESSION: No evidence of pulmonary embolism. Probable Nissen fundoplication accounting for density at the gastroesophageal junction. Question mild diffuse wall thickening of the distal thoracic esophagus,  nonspecific, can be seen with reflux disease and infection.   Original Report Authenticated By: Ulyses Southward, M.D.    MDM   1. Chest pain    The cause of the patient's discomfort that occurred today in the anterior part of the chest is not clear. No evidence of pneumonia no evidence of pulmonary embolism there Nissen fundal plication repair appears to be fairly normal. Only thing that is left of concern would be possible anginal equivalent. First troponin was negative discussed with the admitting team [facility] patient's followed by equal for primary care Dr. does not have a cardiologist. Consider admission for rule out.  Discussed with hospitalist they agreed on the rule out admission probability of being cardiac is small but cannot be completely ruled out. Certainly no evidence of pulmonary embolism certainly no evidence of pneumonia patient did have some unusual for chest pain that occurred at 9:00 swelling that lasted for a few hours. On CT scan the area of the Nissen appears to be fairly normal periods always possible that some of the discomfort could be related to some of the healing process from that however he has not had any symptoms like this in the past.  Shelda Jakes, MD 06/23/13 2006

## 2013-06-24 DIAGNOSIS — K440 Diaphragmatic hernia with obstruction, without gangrene: Secondary | ICD-10-CM

## 2013-06-24 DIAGNOSIS — K319 Disease of stomach and duodenum, unspecified: Secondary | ICD-10-CM

## 2013-06-24 LAB — BASIC METABOLIC PANEL
CO2: 25 mEq/L (ref 19–32)
Calcium: 8.8 mg/dL (ref 8.4–10.5)
Chloride: 101 mEq/L (ref 96–112)
Glucose, Bld: 94 mg/dL (ref 70–99)
Sodium: 135 mEq/L (ref 135–145)

## 2013-06-24 LAB — CBC
Hemoglobin: 12.5 g/dL — ABNORMAL LOW (ref 13.0–17.0)
MCH: 33.4 pg (ref 26.0–34.0)
Platelets: 346 10*3/uL (ref 150–400)
RBC: 3.74 MIL/uL — ABNORMAL LOW (ref 4.22–5.81)
WBC: 4.8 10*3/uL (ref 4.0–10.5)

## 2013-06-24 LAB — TROPONIN I
Troponin I: <0.3 ng/mL (ref <0.30)
Troponin I: <0.3 ng/mL (ref <0.30)

## 2013-06-24 MED ORDER — FLUTICASONE PROPIONATE 50 MCG/ACT NA SUSP: 2.0000 | Freq: Every day | NASAL | Status: DC

## 2013-06-24 NOTE — Progress Notes (Signed)
Utilization Review completed.  

## 2013-06-24 NOTE — Discharge Summary (Signed)
Physician Discharge Summary  Lee Collins WUJ:[id number] DOB: [birth year] DOA: 06/23/2013  PCP: Lee Blamer, MD  Admit date: 06/23/2013 Discharge date: 06/24/2013  Time spent: 40 minutes  Recommendations for Outpatient Follow-up:  1. Followup with primary care physician in 1 week  Discharge Diagnoses:  Principal Problem:   Chest pain   Discharge Condition: Stable  Diet recommendation: Heart healthy diet/pured  Filed Weights   06/23/13 1427 06/23/13 2030  Weight: 69.4 kg (153 lb) 68.221 kg (150 lb 6.4 oz)    History of present illness:  Lee Collins is a 67 y.o. male s/p Nissen Fundoplication last Friday who presents to the ED with SOB and anterior chest pain this morning. Pain was worse with deep inspiration, also has some pain in L shoulder. He has been having some dizziness since discharge but wonders if this is due to the increase in the dose of flomax they put him on after they removed his urinary catheter. EDP requests the patient be admitted as CP r/o cardiac.  Hospital Course:   1. Chest pain: Likely related to his recent surgery, as mentioned above patient undergone Nissen fundoplication on 06/15/2013 laparoscopically, done by Dr. Michaell Cowing. Patient's chest pain is very atypical, upon admission to the hospital CT angiogram was done and showed no evidence PE. ACS was ruled out via -3 sets of cardiac enzymes, his EKG did not show any evidence of ischemia. Patient reported that before the surgery he used to run 4-5 miles a day 5 days a week. This indicates excellent functional status, patient is very low riskt. He'll be discharged home followup with primary care physician for further evaluation. Chest pain is likely secondary to the recent surgery, patient was not taking his pain medication instructed to take them as needed.  2. Congestion: Patient said this is his allergy season, Flonase prescribed. Patient takes Claritin as needed.  3. Recent hiatal hernia surgery: Patient to  followup with his primary care physician and his surgeon.  4. BPH with urinary retention: With Foley catheter in place, patient follows with Dr. Mena Goes as outpatient next Tuesday for trial to remove the Foley catheter.  Procedures:  None  Consultations:  None  Discharge Exam: Filed Vitals:   06/24/13 0500  BP: 121/62  Pulse: 63  Temp: 98.1 F (36.7 C)  Resp: 18   General: Alert and awake, oriented x3, not in any acute distress. HEENT: anicteric sclera, pupils reactive to light and accommodation, EOMI CVS: S1-S2 clear, no murmur rubs or gallops Chest: clear to auscultation bilaterally, no wheezing, rales or rhonchi Abdomen: soft nontender, nondistended, normal bowel sounds, no organomegaly Extremities: no cyanosis, clubbing or edema noted bilaterally Neuro: Cranial nerves II-XII intact, no focal neurological deficits  Discharge Instructions  Discharge Orders   Future Appointments Provider Department Dept Phone   07/03/2013 1:30 PM Ardeth Sportsman, MD [facility], [place] [phone]   Future Orders Complete By Expires   Diet - low sodium heart healthy  As directed    Increase activity slowly  As directed        Medication List         aspirin EC 81 MG tablet  Take 162 mg by mouth daily.     fluticasone 50 MCG/ACT nasal spray  Commonly known as:  FLONASE  Place 2 sprays into the nose daily.     ondansetron 4 MG tablet  Commonly known as:  ZOFRAN  Take 1 tablet (4 mg total) by mouth every 8 (eight) hours as needed for  nausea.     promethazine 25 MG suppository  Commonly known as:  PHENERGAN  Place 1 suppository (25 mg total) rectally every 6 (six) hours as needed for nausea (Use if cannot keep anything down by mouth.).     tamsulosin 0.4 MG Caps capsule  Commonly known as:  FLOMAX  Take 0.8 mg by mouth daily.       Allergies  Allergen Reactions  . Latex Rash  . Betadine [Povidone Iodine] Rash  . Penicillins Rash       Follow-up  Information   Follow up with Lee Blamer, MD In 1 week.   Specialty:  Family Medicine   Contact information:   [facility] [address] [place] [address] [phone]        The results of significant diagnostics from this hospitalization (including imaging, microbiology, ancillary and laboratory) are listed below for reference.    Significant Diagnostic Studies: Dg Chest 1 View  06/15/2013   *RADIOLOGY REPORT*  Clinical Data: Emesis, hiatal hernia  CHEST - 1 VIEW  Comparison: 06/15/2013  Findings: Increased gas and decreased fluid within the large hernia though the degree of distension is similar.  The NG tube  descends straight into the subdiaphragmatic level/left upper quadrant, raising possibility that the hernia is paresophageal.  IMPRESSION: NG tube tip over the left upper quadrant.  Findings as above.   Original Report Authenticated By: Jearld Lesch, M.D.   Dg Chest 2 View  06/23/2013   *RADIOLOGY REPORT*  Clinical Data: Chest pain and shortness of breath.  CHEST - 2 VIEW  Comparison: Chest x-ray 06/15/2013.  Findings: Lung volumes are normal.  No consolidative airspace disease.  No pleural effusions.  No pneumothorax.  No pulmonary nodule or mass noted.  Pulmonary vasculature and the cardiomediastinal silhouette are within normal limits. Atherosclerosis in the thoracic aorta.  Previously noted massive hiatal hernia is no longer identified.  IMPRESSION: 1. No radiographic evidence of acute cardiopulmonary disease. 2.  Atherosclerosis.   Original Report Authenticated By: Trudie Reed, M.D.   Ct Angio Chest Pe W/cm &/or Wo Cm  06/23/2013   *RADIOLOGY REPORT*  Clinical Data: Chest pain, shortness of breath, hard debris, pain in chest and back when taking deep breath  CT ANGIOGRAPHY CHEST  Technique:  Multidetector CT imaging of the chest using the standard protocol during bolus administration of intravenous contrast. Multiplanar reconstructed  images including MIPs were obtained and reviewed to evaluate the vascular anatomy.  Contrast: OMNIPAQUE IOHEXOL 350 MG/ML SOLN pain  Comparison: None.  Findings: Atherosclerotic calcifications of the coronary arteries and less in the thoracic aorta. No aortic aneurysm or dissection. Mild gaseous distention of the distal thoracic esophagus with question mild wall thickening. Large soft tissue defect is identified at the gastroesophageal junction likely representing Nissen fundoplication though this makes it difficult to exclude mass. No thoracic adenopathy.  Remaining visualized portions of upper abdomen unremarkable. Pulmonary arteries patent. No evidence of pulmonary embolism. Dependent atelectasis and right lower lobe. Remaining lungs clear. No infiltrate, pleural effusion or pneumothorax. No acute osseous findings.  IMPRESSION: No evidence of pulmonary embolism. Probable Nissen fundoplication accounting for density at the gastroesophageal junction. Question mild diffuse wall thickening of the distal thoracic esophagus, nonspecific, can be seen with reflux disease and infection.   Original Report Authenticated By: Ulyses Southward, M.D.   Ct Abdomen Pelvis W Contrast  06/15/2013   *RADIOLOGY REPORT*  Clinical Data: Emesis, hiatal hernia.  CT ABDOMEN AND PELVIS WITH CONTRAST  Technique:  Multidetector CT imaging of the abdomen and pelvis was performed following the standard protocol during bolus administration of intravenous contrast.  Contrast: OMNIPAQUE IOHEXOL 300 MG/ML  SOLN  Comparison: 06/15/2013 radiographs  Findings: Mild left lung base opacity, favor atelectasis.  Normal heart size.  Coronary artery calcifications.  Intrathoracic stomach with organoaxial rotation.  Narrowing of the gastric outlet due to the hernia with no contrast reaching the duodenum.  NG tube descends into the stomach. No pneumatosis.  A couple subcentimeter hepatic hypodensities are nonspecific however favored to reflect  biliary cyst or hamartoma.  Hypodensity adjacent to the falciform, favored to reflect focal fat or variant perfusion.  Partially contracted gallbladder with mild wall thickening, nonspecific.  No biliary ductal dilatation.  No radiodense gallstones.  Unremarkable spleen, pancreas, adrenal glands.  Subcentimeter hypodensity right kidney.  Otherwise, symmetric renal enhancement.  No hydroureteronephrosis.  No CT evidence for colitis.  Appendix not identified.  No right lower quadrant inflammation.  Small bowel loops are decompressed and normal in course.  No free intraperitoneal air or fluid.  No lymphadenopathy.  Thin-walled bladder.  Marked prostatomegaly with heterogeneous/nodular enhancement.  Tiny fat containing left umbilical hernia.  Normal caliber aorta and branch vessels with mild scattered atherosclerotic disease.  No acute osseous finding.  Mild multilevel degenerative change.  IMPRESSION: Findings are most in keeping with obstructing organoaxial gastric volvulus. Recommend surgical consultation.  Marked prostatomegaly with heterogeneous/nodular enhancement. Recommend non emergent workup to include PSA and digital rectal exam.  Discussed via telephone with Dr. Patria Mane at 03:50 a.m. on 06/15/2013.   Original Report Authenticated By: Jearld Lesch, M.D.   Dg Abd Acute W/chest  06/15/2013   *RADIOLOGY REPORT*  Clinical Data: Upper abdominal pain  ACUTE ABDOMEN SERIES (ABDOMEN 2 VIEW & CHEST 1 VIEW)  Comparison: None.  Findings: Lungs predominately clear.  Large hiatal hernia. Cardiomediastinal contours otherwise within normal range.  No free intraperitoneal air.  Large stool burden.  Relative paucity of small bowel gas.  Mild curvature of the spine.  Osteopenia.  IMPRESSION: Large hiatal hernia and a paucity of small bowel gas, can be seen with fluid filled or decompressed loops. Recommend NG tube decompression and cross-sectional imaging if clinically concerned for acute gastric obstruction or volvulus.   Large stool burden.   Original Report Authenticated By: Jearld Lesch, M.D.   Dg Esophagus W/water Sol Cm  06/16/2013   *RADIOLOGY REPORT*  Clinical Data:1 day postop para esophageal hernia repair.  Rule out leak or obstruction  ESOPHAGUS/BARIUM SWALLOW/TABLET STUDY  Fluoroscopy Time: 1-minute-6-second  Comparison:   CT 06/15/2013  Findings: Postop hiatal hernia repair with Nissen fundoplication. There is a surgical drain within the mediastinum around the distal esophagus.  Satisfactory hiatal hernia reduction and repair.  There is edema at the gastroesophageal junction.  There is a thin patent lumen which partially drains the esophagus.  With the patient standing, contrast remained in the esophagus and was slow to drain into the stomach suggesting edema at the repair site.  No leak.  The stomach is decompressed and is within the abdominal cavity.  IMPRESSION: Satisfactory hiatal hernia repair.  There is no leak.  There is edema at the GE junction with narrowing of the lumen.  Esophagus was slow to empty into the stomach.   Original Report Authenticated By: Janeece Riggers, M.D.    Microbiology: No results found for this or any previous visit (from the past 240 hour(s)).   Labs: Basic Metabolic Panel:  Recent Labs Lab  06/23/13 1454 06/24/13 0145  NA 134* 135  K 3.6 3.9  CL 98 101  CO2 26 25  GLUCOSE 97 94  BUN 16 14  CREATININE 0.77 0.77  CALCIUM 9.1 8.8   Liver Function Tests:  Recent Labs Lab 06/23/13 1454  AST 55*  ALT 159*  ALKPHOS 56  BILITOT 0.5  PROT 7.1  ALBUMIN 3.2*    Recent Labs Lab 06/23/13 1454  LIPASE 17   No results found for this basename: AMMONIA,  in the last 168 hours CBC:  Recent Labs Lab 06/23/13 1454 06/24/13 0145  WBC 7.8 4.8  HGB 14.3 12.5*  HCT 39.6 35.1*  MCV 93.8 93.9  PLT 372 346   Cardiac Enzymes:  Recent Labs Lab 06/23/13 1815 06/24/13 0135 06/24/13 0734  TROPONINI <0.30 <0.30 <0.30   BNP: BNP (last 3 results) No results  found for this basename: PROBNP,  in the last 8760 hours CBG: No results found for this basename: GLUCAP,  in the last 168 hours     Signed:  Yen Wandell A  [facility] 06/24/2013, 10:35 AM

## 2013-06-26 ENCOUNTER — Emergency Department (HOSPITAL_COMMUNITY)
Admission: EM | Admit: 2013-06-26 | Discharge: 2013-06-26 | Disposition: A | Discharge status: Home / Self Care | Payer: BC Managed Care – PPO | Attending: Emergency Medicine | Admitting: Emergency Medicine

## 2013-06-26 ENCOUNTER — Encounter (HOSPITAL_COMMUNITY): Payer: Self-pay | Admitting: Emergency Medicine

## 2013-06-26 DIAGNOSIS — N400 Enlarged prostate without lower urinary tract symptoms: Secondary | ICD-10-CM | POA: Insufficient documentation

## 2013-06-26 DIAGNOSIS — Z79899 Other long term (current) drug therapy: Secondary | ICD-10-CM | POA: Insufficient documentation

## 2013-06-26 DIAGNOSIS — Y846 Urinary catheterization as the cause of abnormal reaction of the patient, or of later complication, without mention of misadventure at the time of the procedure: Secondary | ICD-10-CM | POA: Insufficient documentation

## 2013-06-26 DIAGNOSIS — Z7982 Long term (current) use of aspirin: Secondary | ICD-10-CM | POA: Insufficient documentation

## 2013-06-26 DIAGNOSIS — T83091A Other mechanical complication of indwelling urethral catheter, initial encounter: Secondary | ICD-10-CM

## 2013-06-26 DIAGNOSIS — Z880 Allergy status to penicillin: Secondary | ICD-10-CM | POA: Insufficient documentation

## 2013-06-26 DIAGNOSIS — Z9889 Other specified postprocedural states: Secondary | ICD-10-CM | POA: Insufficient documentation

## 2013-06-26 DIAGNOSIS — Z91040 Latex allergy status: Secondary | ICD-10-CM | POA: Insufficient documentation

## 2013-06-26 NOTE — ED Provider Notes (Signed)
CSN: [id number]     Arrival date & time 06/26/13  2300 History   First MD Initiated Contact with Patient 06/26/13 2301     Chief Complaint  Patient presents with  . Hematuria    HPI Patient reports developing urinary obstruction despite a Foley catheter in place today.  His recent abdominal surgery complicated by postoperative urinary retention.  He has a history of benign prostatic hypertrophy and is being followed by urology.  He is scheduled to see urology in 2 days however tonight his catheter acutely became obstructed not physically can urinate and feels like he has spasms of the bladder.  No other complaints.  He did have old and hematuria tonight prior to his obstructive process occurring.  No other complaints.  His pain is mild to moderate in severity this time.   Past Medical History  Diagnosis Date  . Hiatal hernia    Past Surgical History  Procedure Laterality Date  . Appendectomy    . Left elbow surgery    . Vocal cord surgery    . Right rotator cuff repair    . Tonsillectomy    . Laparoscopic nissen fundoplication N/A 06/15/2013    Procedure: LAPAROSCOPIC PARAESOPHAGEAL HERNIA  ;  Surgeon: Ardeth Sportsman, MD;  Location: [facility];  Service: General;  Laterality: N/A;   No family history on file. History  Substance Use Topics  . Smoking status: Never Smoker   . Smokeless tobacco: Never Used  . Alcohol Use: No    Review of Systems  All other systems reviewed and are negative.    Allergies  Latex; Betadine; and Penicillins  Home Medications   Current Outpatient Rx  Name  Route  Sig  Dispense  Refill  . aspirin EC 81 MG tablet   Oral   Take 81-162 mg by mouth daily.          . tamsulosin (FLOMAX) 0.4 MG CAPS capsule   Oral   Take 0.8 mg by mouth daily.          There were no vitals taken for this visit. Physical Exam  Nursing note and vitals reviewed. Constitutional: He is oriented to person, place, and time. He appears well-developed and  well-nourished.  HENT:  Head: Normocephalic.  Eyes: EOM are normal.  Neck: Normal range of motion.  Pulmonary/Chest: Effort normal.  Abdominal: There is no tenderness.  Full suprapubic region  Musculoskeletal: Normal range of motion.  Neurological: He is alert and oriented to person, place, and time.  Psychiatric: He has a normal mood and affect.    ED Course  Procedures (including critical care time) Labs Review Labs Reviewed - No data to display Imaging Review No results found.  MDM   1. Obstructed Foley catheter, initial encounter    Foley catheter was irrigated with sterile saline.  Resolution of obstruction.  Patient drained approximately 300 cc of urine into the bag.  This is nonbloody appearing.  No obvious clot was noted in the urine.    Lyanne Co, MD 06/26/13 2350

## 2013-06-26 NOTE — ED Notes (Signed)
Pt has Foley catheter, noticed blood in urine earlier today. No longer seeing blood in urine, but pt feels like bladder not emptying.

## 2013-06-29 ENCOUNTER — Ambulatory Visit (INDEPENDENT_AMBULATORY_CARE_PROVIDER_SITE_OTHER): Payer: BC Managed Care – PPO | Admitting: General Surgery

## 2013-06-29 ENCOUNTER — Encounter (INDEPENDENT_AMBULATORY_CARE_PROVIDER_SITE_OTHER): Payer: Self-pay | Admitting: General Surgery

## 2013-06-29 VITALS — BP 108/64 | HR 68 | Temp 98.6°F | Resp 14 | Ht 68.0 in | Wt 149.6 lb

## 2013-06-29 DIAGNOSIS — T8140XA Infection following a procedure, unspecified, initial encounter: Secondary | ICD-10-CM

## 2013-06-29 DIAGNOSIS — T8149XA Infection following a procedure, other surgical site, initial encounter: Secondary | ICD-10-CM | POA: Insufficient documentation

## 2013-06-29 MED ORDER — DOXYCYCLINE HYCLATE 100 MG PO TABS: 100.0000 mg | ORAL_TABLET | Freq: Two times a day (BID) | ORAL | Status: DC

## 2013-06-29 NOTE — Patient Instructions (Signed)
Remove bandage tomorrow. Shower daily and apply a dry bandage over the wound. Take antibiotic as directed.

## 2013-06-29 NOTE — Progress Notes (Signed)
Procedure:  Urgent laparoscopic hiatal hernia repair and Nissen fundoplication  Date:  06/15/2013   History:  He is here for a postoperative visit. He's developed some redness around her left upper quadrant incision. He is tolerating a level I diet. No dysphagia. He was having some left upper chest and shoulder pain and went to the emergency department last week. A cardiac event was ruled out and it was felt that this was a musculoskeletal pain. He is having some gas bloating.  Exam: General- Is in NAD.  Abdomen-soft, left upper quadrant small incision has some surrounding erythema. I removed the scavenger small amount of purulent drainage deep to this. Other incisions are clean and intact.  Assessment:  Superficial wound infection. Does have some gas bloat.  Plan:  Start doxycycline. Wound cleaning explained to him. Try Gas-X or charcoal tablets for gas bloat. Keep appointment with Dr. Michaell Cowing next week.

## 2013-07-03 ENCOUNTER — Encounter (INDEPENDENT_AMBULATORY_CARE_PROVIDER_SITE_OTHER): Payer: Self-pay | Admitting: Surgery

## 2013-07-03 ENCOUNTER — Ambulatory Visit (INDEPENDENT_AMBULATORY_CARE_PROVIDER_SITE_OTHER): Payer: BC Managed Care – PPO | Admitting: Surgery

## 2013-07-03 VITALS — BP 110/82 | HR 68 | Temp 97.6°F | Resp 16 | Ht 68.5 in | Wt 150.6 lb

## 2013-07-03 DIAGNOSIS — K440 Diaphragmatic hernia with obstruction, without gangrene: Secondary | ICD-10-CM

## 2013-07-03 DIAGNOSIS — K319 Disease of stomach and duodenum, unspecified: Secondary | ICD-10-CM

## 2013-07-03 DIAGNOSIS — K3189 Other diseases of stomach and duodenum: Secondary | ICD-10-CM

## 2013-07-03 NOTE — Progress Notes (Signed)
Subjective:     Patient ID: Lee Collins, male   DOB: 1946/06/17, 67 y.o.   MRN: [id number]  HPI  Lee Collins  May 29, 1946 [id number]  Patient Care Team: Johny Blamer, MD as PCP - General (Family Medicine) Ardeth Sportsman, MD as Consulting Physician (General Surgery) Barrie Folk, MD as Consulting Physician (Gastroenterology) Antony Haste, MD as Consulting Physician (Urology)  Procedure (Date: 06/15/2013):  POST-OPERATIVE DIAGNOSIS: Incarcerated paraesophageal hiatal hernia   PROCEDURE:  1. Laparoscopic reduction of paraesophageal hiatal hernia  2. Primary repair of hiatal hernia over pledgets.  3. Type II mediastinal dissection.  4. Anterior & posterior gastropexy.  5. Nissen fundoplication 2cm over a 56-French bougie   SURGEON:  Ardeth Sportsman, MD  Lodema Pilot, DO - Assist   This patient returns for surgical re-evaluation.   Patient had difficulty with urination.  Difficulty getting a catheter back in.  Now has hematuria.  Being followed by urology.  The port sites are healed up.  He was lying on his left side and that irritated the left upper quadrant port site incision.  Has some chest soreness with deep breaths but not severe.  Eating well.  Tolerating a soft diet.  Pupil to belch.  No episodes of nausea or vomiting.  Has had issues with constipation, but thinks the catheter makes that worse.  Normally eats a high-fiber diet.  Trying to improve that.  He is walking without difficulty.  He does some moderate activity at the gymnasium.  He was hoping to get back to his exercise regimen.  Overall he is happy how the abdominal side of things have gone.  Patient Active Problem List   Diagnosis Date Noted  . Organoaxial gastric volvulus 06/15/2013    Priority: High  . Incarcerated paraesophageal hernia s/p lap repair 06/15/2013    Priority: Medium  . Postoperative wound infection 06/29/2013  . Chest pain 06/23/2013    Past Medical History  Diagnosis Date   . Hiatal hernia     Past Surgical History  Procedure Laterality Date  . Appendectomy    . Left elbow surgery    . Vocal cord surgery    . Right rotator cuff repair    . Tonsillectomy    . Laparoscopic nissen fundoplication N/A 06/15/2013    Procedure: LAPAROSCOPIC PARAESOPHAGEAL HERNIA  ;  Surgeon: Ardeth Sportsman, MD;  Location: [facility];  Service: General;  Laterality: N/A;  . Hernia repair      History   Social History  . Marital Status: Married    Spouse Name: N/A    Number of Children: N/A  . Years of Education: N/A   Occupational History  . Not on file.   Social History Main Topics  . Smoking status: Never Smoker   . Smokeless tobacco: Never Used  . Alcohol Use: No  . Drug Use: No  . Sexual Activity: No   Other Topics Concern  . Not on file   Social History Narrative  . No narrative on file    History reviewed. No pertinent family history.  Current Outpatient Prescriptions  Medication Sig Dispense Refill  . doxycycline (VIBRA-TABS) 100 MG tablet Take 1 tablet (100 mg total) by mouth 2 (two) times daily.  14 tablet  0  . tamsulosin (FLOMAX) 0.4 MG CAPS capsule Take 0.8 mg by mouth daily.      Marland Kitchen aspirin EC 81 MG tablet Take 81-162 mg by mouth daily.        No current  facility-administered medications for this visit.     Allergies  Allergen Reactions  . Latex Rash  . Betadine [Povidone Iodine] Rash  . Penicillins Rash    BP 110/82  Pulse 68  Temp(Src) 97.6 F (36.4 C) (Oral)  Resp 16  Ht 5' 8.5" (1.74 m)  Wt 150 lb 9.6 oz (68.312 kg)  BMI 22.56 kg/m2  Dg Chest 1 View  06/15/2013   *RADIOLOGY REPORT*  Clinical Data: Emesis, hiatal hernia  CHEST - 1 VIEW  Comparison: 06/15/2013  Findings: Increased gas and decreased fluid within the large hernia though the degree of distension is similar.  The NG tube  descends straight into the subdiaphragmatic level/left upper quadrant, raising possibility that the hernia is paresophageal.  IMPRESSION: NG tube tip  over the left upper quadrant.  Findings as above.   Original Report Authenticated By: Jearld Lesch, M.D.   Dg Chest 2 View  06/23/2013   *RADIOLOGY REPORT*  Clinical Data: Chest pain and shortness of breath.  CHEST - 2 VIEW  Comparison: Chest x-ray 06/15/2013.  Findings: Lung volumes are normal.  No consolidative airspace disease.  No pleural effusions.  No pneumothorax.  No pulmonary nodule or mass noted.  Pulmonary vasculature and the cardiomediastinal silhouette are within normal limits. Atherosclerosis in the thoracic aorta.  Previously noted massive hiatal hernia is no longer identified.  IMPRESSION: 1. No radiographic evidence of acute cardiopulmonary disease. 2.  Atherosclerosis.   Original Report Authenticated By: Trudie Reed, M.D.   Ct Angio Chest Pe W/cm &/or Wo Cm  06/23/2013   *RADIOLOGY REPORT*  Clinical Data: Chest pain, shortness of breath, hard debris, pain in chest and back when taking deep breath  CT ANGIOGRAPHY CHEST  Technique:  Multidetector CT imaging of the chest using the standard protocol during bolus administration of intravenous contrast. Multiplanar reconstructed images including MIPs were obtained and reviewed to evaluate the vascular anatomy.  Contrast: OMNIPAQUE IOHEXOL 350 MG/ML SOLN pain  Comparison: None.  Findings: Atherosclerotic calcifications of the coronary arteries and less in the thoracic aorta. No aortic aneurysm or dissection. Mild gaseous distention of the distal thoracic esophagus with question mild wall thickening. Large soft tissue defect is identified at the gastroesophageal junction likely representing Nissen fundoplication though this makes it difficult to exclude mass. No thoracic adenopathy.  Remaining visualized portions of upper abdomen unremarkable. Pulmonary arteries patent. No evidence of pulmonary embolism. Dependent atelectasis and right lower lobe. Remaining lungs clear. No infiltrate, pleural effusion or pneumothorax. No acute osseous  findings.  IMPRESSION: No evidence of pulmonary embolism. Probable Nissen fundoplication accounting for density at the gastroesophageal junction. Question mild diffuse wall thickening of the distal thoracic esophagus, nonspecific, can be seen with reflux disease and infection.   Original Report Authenticated By: Ulyses Southward, M.D.   Ct Abdomen Pelvis W Contrast  06/15/2013   *RADIOLOGY REPORT*  Clinical Data: Emesis, hiatal hernia.  CT ABDOMEN AND PELVIS WITH CONTRAST  Technique:  Multidetector CT imaging of the abdomen and pelvis was performed following the standard protocol during bolus administration of intravenous contrast.  Contrast: OMNIPAQUE IOHEXOL 300 MG/ML  SOLN  Comparison: 06/15/2013 radiographs  Findings: Mild left lung base opacity, favor atelectasis.  Normal heart size.  Coronary artery calcifications.  Intrathoracic stomach with organoaxial rotation.  Narrowing of the gastric outlet due to the hernia with no contrast reaching the duodenum.  NG tube descends into the stomach. No pneumatosis.  A couple subcentimeter hepatic hypodensities are nonspecific however favored to reflect biliary  cyst or hamartoma.  Hypodensity adjacent to the falciform, favored to reflect focal fat or variant perfusion.  Partially contracted gallbladder with mild wall thickening, nonspecific.  No biliary ductal dilatation.  No radiodense gallstones.  Unremarkable spleen, pancreas, adrenal glands.  Subcentimeter hypodensity right kidney.  Otherwise, symmetric renal enhancement.  No hydroureteronephrosis.  No CT evidence for colitis.  Appendix not identified.  No right lower quadrant inflammation.  Small bowel loops are decompressed and normal in course.  No free intraperitoneal air or fluid.  No lymphadenopathy.  Thin-walled bladder.  Marked prostatomegaly with heterogeneous/nodular enhancement.  Tiny fat containing left umbilical hernia.  Normal caliber aorta and branch vessels with mild scattered atherosclerotic  disease.  No acute osseous finding.  Mild multilevel degenerative change.  IMPRESSION: Findings are most in keeping with obstructing organoaxial gastric volvulus. Recommend surgical consultation.  Marked prostatomegaly with heterogeneous/nodular enhancement. Recommend non emergent workup to include PSA and digital rectal exam.  Discussed via telephone with Dr. Patria Mane at 03:50 a.m. on 06/15/2013.   Original Report Authenticated By: Jearld Lesch, M.D.   Dg Abd Acute W/chest  06/15/2013   *RADIOLOGY REPORT*  Clinical Data: Upper abdominal pain  ACUTE ABDOMEN SERIES (ABDOMEN 2 VIEW & CHEST 1 VIEW)  Comparison: None.  Findings: Lungs predominately clear.  Large hiatal hernia. Cardiomediastinal contours otherwise within normal range.  No free intraperitoneal air.  Large stool burden.  Relative paucity of small bowel gas.  Mild curvature of the spine.  Osteopenia.  IMPRESSION: Large hiatal hernia and a paucity of small bowel gas, can be seen with fluid filled or decompressed loops. Recommend NG tube decompression and cross-sectional imaging if clinically concerned for acute gastric obstruction or volvulus.  Large stool burden.   Original Report Authenticated By: Jearld Lesch, M.D.   Dg Esophagus W/water Sol Cm  06/16/2013   *RADIOLOGY REPORT*  Clinical Data:1 day postop para esophageal hernia repair.  Rule out leak or obstruction  ESOPHAGUS/BARIUM SWALLOW/TABLET STUDY  Fluoroscopy Time: 1-minute-6-second  Comparison:   CT 06/15/2013  Findings: Postop hiatal hernia repair with Nissen fundoplication. There is a surgical drain within the mediastinum around the distal esophagus.  Satisfactory hiatal hernia reduction and repair.  There is edema at the gastroesophageal junction.  There is a thin patent lumen which partially drains the esophagus.  With the patient standing, contrast remained in the esophagus and was slow to drain into the stomach suggesting edema at the repair site.  No leak.  The stomach is  decompressed and is within the abdominal cavity.  IMPRESSION: Satisfactory hiatal hernia repair.  There is no leak.  There is edema at the GE junction with narrowing of the lumen.  Esophagus was slow to empty into the stomach.   Original Report Authenticated By: Janeece Riggers, M.D.     Review of Systems  Constitutional: Negative for fever, chills and diaphoresis.  HENT: Negative for sore throat, trouble swallowing and neck pain.   Eyes: Negative for photophobia and visual disturbance.  Respiratory: Negative for choking and shortness of breath.   Cardiovascular: Negative for chest pain and palpitations.  Gastrointestinal: Negative for nausea, vomiting, abdominal distention, anal bleeding and rectal pain.  Genitourinary: Negative for dysuria, urgency, difficulty urinating and testicular pain.  Musculoskeletal: Negative for myalgias, arthralgias and gait problem.  Skin: Negative for color change and rash.  Neurological: Negative for dizziness, speech difficulty, weakness and numbness.  Hematological: Negative for adenopathy.  Psychiatric/Behavioral: Negative for hallucinations, confusion and agitation.       Objective:  Physical Exam  Constitutional: He is oriented to person, place, and time. He appears well-developed and well-nourished. No distress.  HENT:  Head: Normocephalic.  Mouth/Throat: Oropharynx is clear and moist. No oropharyngeal exudate.  Eyes: Conjunctivae and EOM are normal. Pupils are equal, round, and reactive to light. No scleral icterus.  Neck: Normal range of motion. No tracheal deviation present.  Cardiovascular: Normal rate, normal heart sounds and intact distal pulses.   Pulmonary/Chest: Effort normal. No respiratory distress.  Abdominal: Soft. He exhibits no distension. There is no tenderness. Hernia confirmed negative in the right inguinal area and confirmed negative in the left inguinal area.  Incisions clean with normal healing ridges.  No hernias   Musculoskeletal: Normal range of motion. He exhibits no tenderness.  Neurological: He is alert and oriented to person, place, and time. No cranial nerve deficit. He exhibits normal muscle tone. Coordination normal.  Skin: Skin is warm and dry. No rash noted. He is not diaphoretic.  Psychiatric: He has a normal mood and affect. His behavior is normal.       Assessment:     Recovering rather well only 2 1/2 weeks out status post emergent laparoscopic incarcerated paraesophageal hiatal repair for organoaxial volvulus.     Plan:     Increase activity as tolerated to regular activity.  Low impact exercise such as walking an hour a day at least ideal.  It is okay to resume his workout routine as long as he gradually increases into it over the next two weeks.   Do not push through pain.  Followup with Dr Mena Goes with urology concerning his persistent hematuria.  Suspect urethral intimal injury that should heal over time.  Diet as tolerated.  Following esophageal surgery sheet to gradually transition to low fat high fiber diet.  Bowel regimen with 30 g fiber a day and fiber supplement as needed to avoid problems.  Return to clinic 1 month.   Instructions discussed.  Followup with primary care physician for other health issues as would normally be done.  Questions answered.  The patient expressed understanding and appreciation

## 2013-07-03 NOTE — Patient Instructions (Addendum)
LAPAROSCOPIC SURGERY: POST OP INSTRUCTIONS  1. DIET: Follow a light bland diet the first 24 hours after arrival home, such as soup, liquids, crackers, etc.  Be sure to include lots of fluids daily.  Avoid fast food or heavy meals as your are more likely to get nauseated.  Eat a low fat the next few days after surgery.   2. Take your usually prescribed home medications unless otherwise directed. 3. PAIN CONTROL: a. Pain is best controlled by a usual combination of three different methods TOGETHER: i. Ice/Heat ii. Over the counter pain medication iii. Prescription pain medication b. Most patients will experience some swelling and bruising around the incisions.  Ice packs or heating pads (30-60 minutes up to 6 times a day) will help. Use ice for the first few days to help decrease swelling and bruising, then switch to heat to help relax tight/sore spots and speed recovery.  Some people prefer to use ice alone, heat alone, alternating between ice & heat.  Experiment to what works for you.  Swelling and bruising can take several weeks to resolve.   c. It is helpful to take an over-the-counter pain medication regularly for the first few weeks.  Choose one of the following that works best for you: i. Naproxen (Aleve, etc)  Two 236m tabs twice a day ii. Ibuprofen (Advil, etc) Three 2060mtabs four times a day (every meal & bedtime) iii. Acetaminophen (Tylenol, etc) 500-65025mour times a day (every meal & bedtime) d. A  prescription for pain medication (such as oxycodone, hydrocodone, etc) should be given to you upon discharge.  Take your pain medication as prescribed.  i. If you are having problems/concerns with the prescription medicine (does not control pain, nausea, vomiting, rash, itching, etc), please call us [phone] see if we need to switch you to a different pain medicine that will work better for you and/or control your side effect better. ii. If you need a refill on your pain medication,  please contact your pharmacy.  They will contact our office to request authorization. Prescriptions will not be filled after 5 pm or on week-ends. 4. Avoid getting constipated.  Between the surgery and the pain medications, it is common to experience some constipation.  Increasing fluid intake and taking a fiber supplement (such as Metamucil, Citrucel, FiberCon, MiraLax, etc) 1-2 times a day regularly will usually help prevent this problem from occurring.  A mild laxative (prune juice, Milk of Magnesia, MiraLax, etc) should be taken according to package directions if there are no bowel movements after 48 hours.   5. Watch out for diarrhea.  If you have many loose bowel movements, simplify your diet to bland foods & liquids for a few days.  Stop any stool softeners and decrease your fiber supplement.  Switching to mild anti-diarrheal medications (Kayopectate, Pepto Bismol) can help.  If this worsens or does not improve, please call us.[place]. Wash / shower every day.  You may shower over the dressings as they are waterproof.  Continue to shower over incision(s) after the dressing is off. 7. Remove your waterproof bandages 5 days after surgery.  You may leave the incision open to air.  You may replace a dressing/Band-Aid to cover the incision for comfort if you wish.  8. ACTIVITIES as tolerated:   a. You may resume regular (light) daily activities beginning the next day-such as daily self-care, walking, climbing stairs-gradually increasing activities as tolerated.  If you can walk 30 minutes without difficulty, it  is safe to try more intense activity such as jogging, treadmill, bicycling, low-impact aerobics, swimming, etc. b. Save the most intensive and strenuous activity for last such as sit-ups, heavy lifting, contact sports, etc  Refrain from any heavy lifting or straining until you are off narcotics for pain control.   c. DO NOT PUSH THROUGH PAIN.  Let pain be your guide: If it hurts to do something, don't do  it.  Pain is your body warning you to avoid that activity for another week until the pain goes down. d. You may drive when you are no longer taking prescription pain medication, you can comfortably wear a seatbelt, and you can safely maneuver your car and apply brakes. e. Bonita Quin may have sexual intercourse when it is comfortable.  9. FOLLOW UP in our office a. Please call [facility] at [phone] to set up an appointment to see your surgeon in the office for a follow-up appointment approximately 2-3 weeks after your surgery. b. Make sure that you call for this appointment the day you arrive home to insure a convenient appointment time. 10. IF YOU HAVE DISABILITY OR FAMILY LEAVE FORMS, BRING THEM TO THE OFFICE FOR PROCESSING.  DO NOT GIVE THEM TO YOUR DOCTOR.   WHEN TO CALL us [phone]: 1. Poor pain control 2. Reactions / problems with new medications (rash/itching, nausea, etc)  3. Fever over 101.5 F (38.5 C) 4. Inability to urinate 5. Nausea and/or vomiting 6. Worsening swelling or bruising 7. Continued bleeding from incision. 8. Increased pain, redness, or drainage from the incision   The clinic staff is available to answer your questions during regular business hours (8:30am-5pm).  Please don't hesitate to call and ask to speak to one of our nurses for clinical concerns.   If you have a medical emergency, go to the nearest emergency room or call 911.  A surgeon from [facility] is always on call at the [facility], [place] [address], [address], [place], [place]  [address] ? MAIN: [phone] ? TOLL FREE: [phone] ?  FAX [phone] [email or web address]  EATING AFTER YOUR ESOPHAGEAL SURGERY (Stomach Fundoplication, Hiatal Hernia repair, Achalasia surgery, etc)  After your esophageal surgery, expect some sticking with swallowing over the next 1-2 months.    If food sticks when you eat, it is called "dysphagia".  This  is due to swelling around your esophagus at the wrap & hiatal diaphragm repair.  It will gradually ease off over the next few months.  To help you through this temporary phase, we start you out on a pureed (blenderized) diet.  Your first meal in the hospital was thin liquids.  You should have been given a pureed diet by the time you left the hospital.  We ask patients to stay on a pureed diet for the first 2-3 weeks to avoid anything getting "stuck" near your recent surgery.  Don't be alarmed if your ability to swallow doesn't progress according to this plan.  Everyone is different and some diets can advance more or less quickly.     Some BASIC RULES to follow are:  Maintain an upright position whenever eating or drinking.  Take small bites - just a teaspoon size bite at a time.  Eat slowly.  It may also help to eat only one food at a time.  Consider nibbling through smaller, more frequent meals & avoid the urge to eat BIG meals  Do not push through feelings of  fullness, nausea, or bloatedness  Do not mix solid foods and liquids in the same mouthful  Try not to "wash foods down" with large gulps of liquids. Avoid carbonated (bubbly/fizzy) drinks.  Understand that it will be hard to burp and belch at first.  This gradually improves with time.  Expect to be more gassy/flatulent/bloated initially.  Walking will help you work through that.  Maalox/Gas-X can help as well.  Eat in a relaxed atmosphere & minimize distractions.  Avoid talking while eating.    Do not use straws.  Following each meal, sit in an upright position (90 degree angle) for 60 to 90 minutes.  Going for a short walk can help as well  If food does stick, don't panic.  Try to relax and let the food pass on its own.  Sipping WARM LIQUID such as strong hot black tea can also help slide it down.   Be gradual in changes & use common sense:  -If you easily tolerating a certain "level" of foods, advance to the next level  gradually -If you are having trouble swallowing a particular food, then avoid it.   -If food is sticking when you advance your diet, go back to thinner previous diet (the lower LEVEL) for 1-2 days.  LEVEL 1 = PUREED DIET  Do for the first 2 WEEKS AFTER SURGERY  -Foods in this group are pureed or blenderized to a smooth, mashed potato-like consistency.  -If necessary, the pureed foods can keep their shape with the addition of a thickening agent.   -Meat should be pureed to a smooth, pasty consistency.  Hot broth or gravy may be added to the pureed meat, approximately 1 oz. of liquid per 3 oz. serving of meat. -CAUTION:  If any foods do not puree into a smooth consistency, swallowing will be more difficult.  (For example, nuts or seeds sometimes do not blend well.)  Hot Foods Cold Foods  Pureed scrambled eggs and cheese Pureed cottage cheese  Baby cereals Thickened juices and nectars  Thinned cooked cereals (no lumps) Thickened milk or eggnog  Pureed [place] toast or pancakes Ensure  Mashed potatoes Ice cream  Pureed parsley, au gratin, scalloped potatoes, candied sweet potatoes Fruit or [place] ice, sherbet  Pureed buttered or alfredo noodles Plain yogurt  Pureed vegetables (no corn or peas) Instant breakfast  Pureed soups and creamed soups Smooth pudding, mousse, custard  Pureed scalloped apples Whipped gelatin  Gravies Sugar, syrup, honey, jelly  Sauces, cheese, tomato, barbecue, white, creamed Cream  Any baby food Creamer  Alcohol in moderation (not beer or champagne) Margarine  Coffee or tea Mayonnaise   Ketchup, mustard   Apple sauce   SAMPLE MENU:  PUREED DIET Breakfast Lunch Dinner   Orange juice, 1/2 cup  Cream of wheat, 1/2 cup  Pineapple juice, 1/2 cup  Pureed [place], barley soup, 3/4 cup  Pureed Hawaiian chicken, 3 oz   Scrambled eggs, mashed or blended with cheese, 1/2 cup  Tea or coffee, 1 cup   Whole milk, 1 cup   Non-dairy creamer, 2 Tbsp.  Mashed  potatoes, 1/2 cup  Pureed cooled broccoli, 1/2 cup  Apple sauce, 1/2 cup  Coffee or tea  Mashed potatoes, 1/2 cup  Pureed spinach, 1/2 cup  Frozen yogurt, 1/2 cup  Tea or coffee      LEVEL 2 = SOFT DIET  After your first 2 weeks, you can advance to a soft diet.   Keep on this diet until everything goes down  easily.  Hot Foods Cold Foods  White fish Cottage cheese  Stuffed fish Junior baby fruit  Baby food meals Semi thickened juices  Minced soft cooked, scrambled, poached eggs nectars  Souffle & omelets Ripe mashed bananas  Cooked cereals Canned fruit, pineapple sauce, milk  potatoes Milkshake  Buttered or Alfredo noodles Custard  Cooked cooled vegetable Puddings, including tapioca  Sherbet Yogurt  Vegetable soup or alphabet soup Fruit ice, [place] ice  Gravies Whipped gelatin  Sugar, syrup, honey, jelly Junior baby desserts  Sauces:  Cheese, creamed, barbecue, tomato, white Cream  Coffee or tea Margarine   SAMPLE MENU:  LEVEL 2 Breakfast Lunch Dinner   Orange juice, 1/2 cup  Oatmeal, 1/2 cup  Scrambled eggs with cheese, 1/2 cup  Decaffeinated tea, 1 cup  Whole milk, 1 cup  Non-dairy creamer, 2 Tbsp  Pineapple juice, 1/2 cup  Minced beef, 3 oz  Gravy, 2 Tbsp  Mashed potatoes, 1/2 cup  Minced fresh broccoli, 1/2 cup  Applesauce, 1/2 cup  Coffee, 1 cup  [place], barley soup, 3/4 cup  Minced Hawaiian chicken, 3 oz  Mashed potatoes, 1/2 cup  Cooked spinach, 1/2 cup  Frozen yogurt, 1/2 cup  Non-dairy creamer, 2 Tbsp      LEVEL 3 = CHOPPED DIET  -After all the foods in level 2 (soft diet) are passing through well you should advance up to more chopped foods.  -It is still important to cut these foods into small pieces and eat slowly.  Hot Foods Cold Foods  Poultry Cottage cheese  Chopped Swedish meatballs Yogurt  Meat salads (ground or flaked meat) Milk  Flaked fish (tuna) Milkshakes  Poached or scrambled eggs Soft, cold, dry cereal    Souffles and omelets Fruit juices or nectars  Cooked cereals Chopped canned fruit  Chopped [place] toast or pancakes Canned fruit cocktail  Noodles or pasta (no rice) Pudding, mousse, custard  Cooked vegetables (no frozen peas, corn, or mixed vegetables) Green salad  Canned small sweet peas Ice cream  Creamed soup or vegetable soup Fruit ice, [place] ice  Pureed vegetable soup or alphabet soup Non-dairy creamer  Ground scalloped apples Margarine  Gravies Mayonnaise  Sauces:  Cheese, creamed, barbecue, tomato, white Ketchup  Coffee or tea Mustard   SAMPLE MENU:  LEVEL 3 Breakfast Lunch Dinner   Orange juice, 1/2 cup  Oatmeal, 1/2 cup  Scrambled eggs with cheese, 1/2 cup  Decaffeinated tea, 1 cup  Whole milk, 1 cup  Non-dairy creamer, 2 Tbsp  Ketchup, 1 Tbsp  Margarine, 1 tsp  Salt, 1/4 tsp  Sugar, 2 tsp  Pineapple juice, 1/2 cup  Ground beef, 3 oz  Gravy, 2 Tbsp  Mashed potatoes, 1/2 cup  Cooked spinach, 1/2 cup  Applesauce, 1/2 cup  Decaffeinated coffee  Whole milk  Non-dairy creamer, 2 Tbsp  Margarine, 1 tsp  Salt, 1/4 tsp  Pureed [place], barley soup, 3/4 cup  Barbecue chicken, 3 oz  Mashed potatoes, 1/2 cup  Ground fresh broccoli, 1/2 cup  Frozen yogurt, 1/2 cup  Decaffeinated tea, 1 cup  Non-dairy creamer, 2 Tbsp  Margarine, 1 tsp  Salt, 1/4 tsp  Sugar, 1 tsp    LEVEL 4:  REGULAR FOODS  -Foods in this group are soft, moist, regularly textured foods.   -This level includes meat and breads, which tend to be the hardest things to swallow.   -Eat very slowly, chew well and continue to avoid carbonated drinks. -most people are at this level in 4-6 weeks  Hot Foods Cold Foods  Baked fish or skinned Soft cheeses - cottage cheese  Souffles and omelets Cream cheese  Eggs Yogurt  Stuffed shells Milk  Spaghetti with meat sauce Milkshakes  Cooked cereal Cold dry cereals (no nuts, dried fruit, coconut)  [place] toast or pancakes  Crackers  Buttered toast Fruit juices or nectars  Noodles or pasta (no rice) Canned fruit  Potatoes (all types) Ripe bananas  Soft, cooked vegetables (no corn, lima, or baked beans) Peeled, ripe, fresh fruit  Creamed soups or vegetable soup Cakes (no nuts, dried fruit, coconut)  Canned chicken noodle soup Plain doughnuts  Gravies Ice cream  Bacon dressing Pudding, mousse, custard  Sauces:  Cheese, creamed, barbecue, tomato, white Fruit ice, [place] ice, sherbet  Decaffeinated tea or coffee Whipped gelatin  Pork chops Regular gelatin   Canned fruited gelatin molds   Sugar, syrup, honey, jam, jelly   Cream   Non-dairy   Margarine   Oil   Mayonnaise   Ketchup   Mustard    If you have any questions please call our office at [facility]: [phone].  Pelvic floor muscle training exercises  can help strengthen the muscles under the uterus, bladder, and bowel (large intestine). They can help both men and women who have problems with urine leakage or bowel control.  A pelvic floor muscle training exercise is like pretending that you have to urinate, and then holding it. You relax and tighten the muscles that control urine flow. It's important to find the right muscles to tighten.  The next time you have to urinate, start to go and then stop. Feel the muscles in your vagina, bladder, or anus get tight and move up. These are the pelvic floor muscles. If you feel them tighten, you've done the exercise right. If you are still not sure whether you are tightening the right muscles, keep in mind that all of the muscles of the pelvic floor relax and contract at the same time. Because these muscles control the bladder, rectum, and vagina, the following tips may help: Women: Insert a finger into your vagina. Tighten the muscles as if you are holding in your urine, then let go. You should feel the muscles tighten and move up and down.  Men: Insert a finger into your rectum. Tighten the  muscles as if you are holding in your urine, then let go. You should feel the muscles tighten and move up and down. These are the same muscles you would tighten if you were trying to prevent yourself from passing gas.  It is very important that you keep the following muscles relaxed while doing pelvic floor muscle training exercises: Abdominal  Buttocks (the deeper, anal sphincter muscle should contract)  Thigh   A woman can also strengthen these muscles by using a vaginal cone, which is a weighted device that is inserted into the vagina. Then you try to tighten the pelvic floor muscles to hold the device in place. If you are unsure whether you are doing the pelvic floor muscle training correctly, you can use biofeedback and electrical stimulation to help find the correct muscle group to work. Biofeedback is a method of positive reinforcement. Electrodes are placed on the abdomen and along the anal area. Some therapists place a sensor in the vagina in women or anus in men to monitor the contraction of pelvic floor muscles.  A monitor will display a graph showing which muscles are contracting and which are at rest. The therapist can  help find the right muscles for performing pelvic floor muscle training exercises.   PERFORMING PELVIC FLOOR EXERCISES: 1. Begin by emptying your bladder. 2. Tighten the pelvic floor muscles and hold for a count of 10. 3. Relax the muscles completely for a count of 10. 4. Do 10 repititions, 3 to 5 times a day (morning, afternoon, and night). You can do these exercises at any time and any place. Most people prefer to do the exercises while lying down or sitting in a chair. After 4 - 6 weeks, most people notice some improvement. It may take as long as 3 months to see a major change. After a couple of weeks, you can also try doing a single pelvic floor contraction at times when you are likely to leak (for example, while getting out of a chair). A word of caution: Some  people feel that they can speed up the progress by increasing the number of repetitions and the frequency of exercises. However, over-exercising can instead cause muscle fatigue and increase urine leakage. If you feel any discomfort in your abdomen or back while doing these exercises, you are probably doing them wrong. Breathe deeply and relax your body when you are doing these exercises. Make sure you are not tightening your stomach, thigh, buttock, or chest muscles. When done the right way, pelvic floor muscle exercises have been shown to be very effective at improving urinary continence. Alternative Names Kegel exercises  Exercise to Stay Healthy Exercise helps you become and stay healthy. EXERCISE IDEAS AND TIPS Choose exercises that:  You enjoy.  Fit into your day. You do not need to exercise really hard to be healthy. You can do exercises at a slow or medium level and stay healthy. You can:  Stretch before and after working out.  Try yoga, Pilates, or tai chi.  Lift weights.  Walk fast, swim, jog, run, climb stairs, bicycle, dance, or rollerskate.  Take aerobic classes. Exercises that burn about 150 calories:  Running 1  miles in 15 minutes.  Playing volleyball for 45 to 60 minutes.  Washing and waxing a car for 45 to 60 minutes.  Playing touch football for 45 minutes.  Walking 1  miles in 35 minutes.  Pushing a stroller 1  miles in 30 minutes.  Playing basketball for 30 minutes.  Raking leaves for 30 minutes.  Bicycling 5 miles in 30 minutes.  Walking 2 miles in 30 minutes.  Dancing for 30 minutes.  Shoveling snow for 15 minutes.  Swimming laps for 20 minutes.  Walking up stairs for 15 minutes.  Bicycling 4 miles in 15 minutes.  Gardening for 30 to 45 minutes.  Jumping rope for 15 minutes.  Washing windows or floors for 45 to 60 minutes. Document Released: 11/06/2010 Document Revised: 12/27/2011 Document Reviewed: 11/06/2010 [facility] Patient  Information 2014 [place], [place].

## 2013-07-16 DIAGNOSIS — N400 Enlarged prostate without lower urinary tract symptoms: Secondary | ICD-10-CM

## 2013-07-16 DIAGNOSIS — R338 Other retention of urine: Secondary | ICD-10-CM

## 2013-07-16 NOTE — Discharge Summary (Signed)
Physician Discharge Summary  Patient ID: Lee Collins MRN: [id number] DOB/AGE: 1946/09/11 67 y.o.  Admit date: 06/14/2013 Discharge date: 06/19/2013  Admission Diagnoses:  Discharge Diagnoses:  Principal Problem:   Organoaxial gastric volvulus Active Problems:   Incarcerated paraesophageal hernia s/p lap repair   Acute urinary retention   BPH (benign prostatic hyperplasia)   Discharged Condition: good  Hospital Course: Patient with intractable nausea vomiting.  He came into the emergency room and was found to have a giant paraesophageal hiatal hernia with volvulus.  Admitted.  Hydrated.  Underwent urgent laparoscopic reduction and repair of incarcerated paraesophageal hiatal hernia.  Partial fundoplication done.  Patient struggled with postoperative urinary retention and required Foley catheterization and urological intraoperative consultation.  That improved.  He did have a swallow study done which showed no obstruction.  Postoperatively, the patient mobilized in the hallways and advanced to a pureed diet gradually.  Pain was well-controlled and transitioned off IV medications.    By the time of discharge, the patient was walking well the hallways, eating food well, having flatus.  Pain was-controlled on an oral regimen.  Based on meeting DC criteria and recovering well, I felt it was safe for the patient to be discharged home with close followup.  Instructions were discussed in detail.  They are written as well.     Consults: urology  Significant Diagnostic Studies:  Recent Results (from the past 2160 hour(s))  TROPONIN I     Status: None   Collection Time    06/14/13  9:30 PM      Result Value Range   Troponin I <0.30  <0.30 ng/mL   Comment:            Due to the release kinetics of cTnI,     a negative result within the first hours     of the onset of symptoms does not rule out     myocardial infarction with certainty.     If myocardial infarction is still suspected,      repeat the test at appropriate intervals.  CBC     Status: Abnormal   Collection Time    06/14/13 11:10 PM      Result Value Range   WBC 12.8 (*) 4.0 - 10.5 K/uL   RBC 4.97  4.22 - 5.81 MIL/uL   Hemoglobin 16.6  13.0 - 17.0 g/dL   HCT 32.2  02.5 - 42.7 %   MCV 94.2  78.0 - 100.0 fL   MCH 33.4  26.0 - 34.0 pg   MCHC 35.5  30.0 - 36.0 g/dL   RDW 06.2  37.6 - 28.3 %   Platelets 325  150 - 400 K/uL  LIPASE, BLOOD     Status: Abnormal   Collection Time    06/14/13 11:10 PM      Result Value Range   Lipase 133 (*) 11 - 59 U/L  COMPREHENSIVE METABOLIC PANEL     Status: Abnormal   Collection Time    06/14/13 11:10 PM      Result Value Range   Sodium 139  135 - 145 mEq/L   Potassium 3.6  3.5 - 5.1 mEq/L   Chloride 100  96 - 112 mEq/L   CO2 29  19 - 32 mEq/L   Glucose, Bld 145 (*) 70 - 99 mg/dL   BUN 18  6 - 23 mg/dL   Creatinine, Ser 1.51  0.50 - 1.35 mg/dL   Calcium 9.6  8.4 - 76.1 mg/dL  Total Protein 7.5  6.0 - 8.3 g/dL   Albumin 4.1  3.5 - 5.2 g/dL   AST 23  0 - 37 U/L   ALT 20  0 - 53 U/L   Alkaline Phosphatase 77  39 - 117 U/L   Total Bilirubin 0.9  0.3 - 1.2 mg/dL   GFR calc non Af Amer 89 (*) >90 mL/min   GFR calc Af Amer >90  >90 mL/min   Comment: (NOTE)     The eGFR has been calculated using the CKD EPI equation.     This calculation has not been validated in all clinical situations.     eGFR's persistently <90 mL/min signify possible Chronic Kidney     Disease.  BASIC METABOLIC PANEL     Status: Abnormal   Collection Time    06/16/13 11:10 AM      Result Value Range   Sodium 131 (*) 135 - 145 mEq/L   Comment: RESULT REPEATED AND VERIFIED     DELTA CHECK NOTED   Potassium 3.4 (*) 3.5 - 5.1 mEq/L   Chloride 97  96 - 112 mEq/L   CO2 27  19 - 32 mEq/L   Glucose, Bld 115 (*) 70 - 99 mg/dL   BUN 15  6 - 23 mg/dL   Creatinine, Ser 5.40  0.50 - 1.35 mg/dL   Calcium 9.0  8.4 - 98.1 mg/dL   GFR calc non Af Amer 86 (*) >90 mL/min   GFR calc Af Amer >90  >90 mL/min    Comment: (NOTE)     The eGFR has been calculated using the CKD EPI equation.     This calculation has not been validated in all clinical situations.     eGFR's persistently <90 mL/min signify possible Chronic Kidney     Disease.  CBC     Status: Abnormal   Collection Time    06/23/13  2:54 PM      Result Value Range   WBC 7.8  4.0 - 10.5 K/uL   RBC 4.22  4.22 - 5.81 MIL/uL   Hemoglobin 14.3  13.0 - 17.0 g/dL   HCT 19.1  47.8 - 29.5 %   MCV 93.8  78.0 - 100.0 fL   MCH 33.9  26.0 - 34.0 pg   MCHC 36.1 (*) 30.0 - 36.0 g/dL   RDW 62.1  30.8 - 65.7 %   Platelets 372  150 - 400 K/uL  COMPREHENSIVE METABOLIC PANEL     Status: Abnormal   Collection Time    06/23/13  2:54 PM      Result Value Range   Sodium 134 (*) 135 - 145 mEq/L   Potassium 3.6  3.5 - 5.1 mEq/L   Chloride 98  96 - 112 mEq/L   CO2 26  19 - 32 mEq/L   Glucose, Bld 97  70 - 99 mg/dL   BUN 16  6 - 23 mg/dL   Creatinine, Ser 8.46  0.50 - 1.35 mg/dL   Calcium 9.1  8.4 - 96.2 mg/dL   Total Protein 7.1  6.0 - 8.3 g/dL   Albumin 3.2 (*) 3.5 - 5.2 g/dL   AST 55 (*) 0 - 37 U/L   ALT 159 (*) 0 - 53 U/L   Alkaline Phosphatase 56  39 - 117 U/L   Total Bilirubin 0.5  0.3 - 1.2 mg/dL   GFR calc non Af Amer >90  >90 mL/min   GFR calc Af Amer >90  >90  mL/min   Comment: (NOTE)     The eGFR has been calculated using the CKD EPI equation.     This calculation has not been validated in all clinical situations.     eGFR's persistently <90 mL/min signify possible Chronic Kidney     Disease.  APTT     Status: None   Collection Time    06/23/13  2:54 PM      Result Value Range   aPTT 32  24 - 37 seconds  LIPASE, BLOOD     Status: None   Collection Time    06/23/13  2:54 PM      Result Value Range   Lipase 17  11 - 59 U/L  POCT I-STAT TROPONIN I     Status: None   Collection Time    06/23/13  3:03 PM      Result Value Range   Troponin i, poc 0.00  0.00 - 0.08 ng/mL   Comment 3            Comment: Due to the release  kinetics of cTnI,     a negative result within the first hours     of the onset of symptoms does not rule out     myocardial infarction with certainty.     If myocardial infarction is still suspected,     repeat the test at appropriate intervals.  TROPONIN I     Status: None   Collection Time    06/23/13  6:15 PM      Result Value Range   Troponin I <0.30  <0.30 ng/mL   Comment:            Due to the release kinetics of cTnI,     a negative result within the first hours     of the onset of symptoms does not rule out     myocardial infarction with certainty.     If myocardial infarction is still suspected,     repeat the test at appropriate intervals.  TROPONIN I     Status: None   Collection Time    06/24/13  1:35 AM      Result Value Range   Troponin I <0.30  <0.30 ng/mL   Comment:            Due to the release kinetics of cTnI,     a negative result within the first hours     of the onset of symptoms does not rule out     myocardial infarction with certainty.     If myocardial infarction is still suspected,     repeat the test at appropriate intervals.  CBC     Status: Abnormal   Collection Time    06/24/13  1:45 AM      Result Value Range   WBC 4.8  4.0 - 10.5 K/uL   RBC 3.74 (*) 4.22 - 5.81 MIL/uL   Hemoglobin 12.5 (*) 13.0 - 17.0 g/dL   HCT 16.1 (*) 09.6 - 04.5 %   MCV 93.9  78.0 - 100.0 fL   MCH 33.4  26.0 - 34.0 pg   MCHC 35.6  30.0 - 36.0 g/dL   RDW 40.9  81.1 - 91.4 %   Platelets 346  150 - 400 K/uL  BASIC METABOLIC PANEL     Status: None   Collection Time    06/24/13  1:45 AM      Result Value Range   Sodium 135  135 - 145 mEq/L   Potassium 3.9  3.5 - 5.1 mEq/L   Chloride 101  96 - 112 mEq/L   CO2 25  19 - 32 mEq/L   Glucose, Bld 94  70 - 99 mg/dL   BUN 14  6 - 23 mg/dL   Creatinine, Ser 6.57  0.50 - 1.35 mg/dL   Calcium 8.8  8.4 - 84.6 mg/dL   GFR calc non Af Amer >90  >90 mL/min   GFR calc Af Amer >90  >90 mL/min   Comment: (NOTE)     The eGFR has  been calculated using the CKD EPI equation.     This calculation has not been validated in all clinical situations.     eGFR's persistently <90 mL/min signify possible Chronic Kidney     Disease.  TROPONIN I     Status: None   Collection Time    06/24/13  7:34 AM      Result Value Range   Troponin I <0.30  <0.30 ng/mL   Comment:            Due to the release kinetics of cTnI,     a negative result within the first hours     of the onset of symptoms does not rule out     myocardial infarction with certainty.     If myocardial infarction is still suspected,     repeat the test at appropriate intervals.    Treatments:   PROCEDURE:  1. Laparoscopic reduction of paraesophageal hiatal hernia  2. Primary repair of hiatal hernia over pledgets.  3. Type II mediastinal dissection.  4. Anterior & posterior gastropexy.  5. Nissen fundoplication 2cm over a 56-French bougie   SURGEON:  Ardeth Sportsman, MD  Lodema Pilot, DO - Assist   Discharge Exam: Blood pressure 127/89, pulse 80, temperature 98.6 F (37 C), temperature source Oral, resp. rate 16, height 5\' 8"  (1.727 m), weight 153 lb 4.8 oz (69.536 kg), SpO2 98.00%. General: Pt awake/alert/oriented x4 in no major acute distress Eyes: PERRL, normal EOM. Sclera nonicteric Neuro: CN II-XII intact w/o focal sensory/motor deficits. Lymph: No head/neck/groin lymphadenopathy Psych:  No delerium/psychosis/paranoia HENT: Normocephalic, Mucus membranes moist.  No thrush Neck: Supple, No tracheal deviation Chest: No pain.  Good respiratory excursion. CV:  Pulses intact.  Regular rhythm MS: Normal AROM mjr joints.  No obvious deformity Abdomen: Soft, Nondistended.  Min tender at laparoscopic incisions.  No incarcerated hernias. GU Foley in place.  Mild edema of genitalia Ext:  SCDs BLE.  No significant edema.  No cyanosis Skin: No petechiae / purpura   Disposition: 01-Home or Self Care  Discharge Orders   Future Appointments Provider  Department Dept Phone   08/06/2013 9:15 AM Ardeth Sportsman, MD [facility], [place] [phone]   Future Orders Complete By Expires   Call MD for:  persistant nausea and vomiting  As directed    Call MD for:  redness, tenderness, or signs of infection (pain, swelling, redness, odor or green/yellow discharge around incision site)  As directed    Call MD for:  severe uncontrolled pain  As directed    Call MD for:  temperature >100.4  As directed    Discharge instructions  As directed    Comments:     May shower tomorrow. Call [phone] to schedule follow up with Dr. Michaell Cowing in 10-14 days Call [facility] to schedule follow up with Dr. Mena Goes No lifting or straining Full liquid or pureed  diet until follow up with Dr. Michaell Cowing.   Increase activity slowly  As directed        Medication List    STOP taking these medications       aspirin 325 MG tablet     famotidine 20 MG tablet  Commonly known as:  PEPCID           Follow-up Information   Follow up with Eskridge, Lowella Petties, MD. Schedule an appointment as soon as possible for a visit in 3 weeks.   Specialty:  Urology   Contact information:   [address] [place] [address] [phone]       Follow up with Ardeth Sportsman., MD In 1 week.   Specialty:  General Surgery   Contact information:   [address] [place] [address] [phone]       Signed: Ardeth Sportsman. 07/16/2013, 2:54 PM

## 2013-07-17 ENCOUNTER — Encounter (HOSPITAL_COMMUNITY): Payer: Self-pay | Admitting: Emergency Medicine

## 2013-07-17 DIAGNOSIS — R972 Elevated prostate specific antigen [PSA]: Secondary | ICD-10-CM | POA: Insufficient documentation

## 2013-07-17 DIAGNOSIS — N400 Enlarged prostate without lower urinary tract symptoms: Secondary | ICD-10-CM | POA: Insufficient documentation

## 2013-07-17 DIAGNOSIS — N3289 Other specified disorders of bladder: Secondary | ICD-10-CM | POA: Insufficient documentation

## 2013-07-17 DIAGNOSIS — T8389XA Other specified complication of genitourinary prosthetic devices, implants and grafts, initial encounter: Secondary | ICD-10-CM | POA: Insufficient documentation

## 2013-07-17 DIAGNOSIS — Y846 Urinary catheterization as the cause of abnormal reaction of the patient, or of later complication, without mention of misadventure at the time of the procedure: Secondary | ICD-10-CM | POA: Insufficient documentation

## 2013-07-17 DIAGNOSIS — R338 Other retention of urine: Principal | ICD-10-CM | POA: Insufficient documentation

## 2013-07-17 DIAGNOSIS — R5381 Other malaise: Secondary | ICD-10-CM | POA: Insufficient documentation

## 2013-07-17 DIAGNOSIS — R310 Gross hematuria: Secondary | ICD-10-CM | POA: Insufficient documentation

## 2013-07-17 NOTE — ED Notes (Addendum)
Pt stated that he has not had any urine collect in his catheter drainage bag for the past 2 hours, after changing from his leg bag.  Pt reports intermittent pain around the catheter.  Pt reports having his catheter changed x1 wk ago to a smaller fr. coude d/t irritation.

## 2013-07-18 ENCOUNTER — Observation Stay (HOSPITAL_COMMUNITY)
Admission: EM | Admit: 2013-07-18 | Discharge: 2013-07-18 | Disposition: A | Discharge status: Home / Self Care | Payer: BC Managed Care – PPO | Attending: Urology | Admitting: Urology

## 2013-07-18 DIAGNOSIS — R338 Other retention of urine: Secondary | ICD-10-CM

## 2013-07-18 LAB — URINALYSIS, ROUTINE W REFLEX MICROSCOPIC
Nitrite: POSITIVE — AB
Specific Gravity, Urine: 1.03 (ref 1.005–1.030)
Urobilinogen, UA: 0.2 mg/dL (ref 0.0–1.0)
pH: 5 (ref 5.0–8.0)

## 2013-07-18 LAB — POCT I-STAT, CHEM 8
Chloride: 98 mEq/L (ref 96–112)
Glucose, Bld: 120 mg/dL — ABNORMAL HIGH (ref 70–99)
HCT: 36 % — ABNORMAL LOW (ref 39.0–52.0)
Hemoglobin: 12.2 g/dL — ABNORMAL LOW (ref 13.0–17.0)
Potassium: 3.8 mEq/L (ref 3.5–5.1)
Sodium: 134 mEq/L — ABNORMAL LOW (ref 135–145)

## 2013-07-18 LAB — URINE MICROSCOPIC-ADD ON

## 2013-07-18 MED ORDER — BELLADONNA ALKALOIDS-OPIUM 16.2-60 MG RE SUPP
1.0000 | Freq: Once | RECTAL | Status: AC
  Administered 2013-07-18: 1 via RECTAL
  Filled 2013-07-18: qty 1

## 2013-07-18 MED ORDER — FENTANYL CITRATE 0.05 MG/ML IJ SOLN
50.0000 ug | INTRAMUSCULAR | Status: DC | PRN
  Administered 2013-07-18: 50 ug via INTRAVENOUS

## 2013-07-18 MED ORDER — CIPROFLOXACIN HCL 500 MG PO TABS
ORAL_TABLET | ORAL | Status: AC
  Filled 2013-07-18: qty 1

## 2013-07-18 MED ORDER — ASPIRIN EC 81 MG PO TBEC: 81.0000 mg | DELAYED_RELEASE_TABLET | ORAL | Status: DC

## 2013-07-18 MED ORDER — ONDANSETRON HCL 4 MG/2ML IJ SOLN
INTRAMUSCULAR | Status: AC
  Filled 2013-07-18: qty 2

## 2013-07-18 MED ORDER — BELLADONNA ALKALOIDS-OPIUM 16.2-60 MG RE SUPP: 1.0000 | Freq: Four times a day (QID) | RECTAL | Status: DC | PRN

## 2013-07-18 MED ORDER — MORPHINE SULFATE 2 MG/ML IJ SOLN: 2.0000 mg | INTRAMUSCULAR | Status: DC | PRN

## 2013-07-18 MED ORDER — HYDROCODONE-ACETAMINOPHEN 5-325 MG PO TABS: 1.0000 | ORAL_TABLET | ORAL | Status: DC | PRN

## 2013-07-18 MED ORDER — ONDANSETRON HCL 4 MG/2ML IJ SOLN: 4.0000 mg | INTRAMUSCULAR | Status: DC | PRN

## 2013-07-18 MED ORDER — CIPROFLOXACIN HCL 500 MG PO TABS
500.0000 mg | ORAL_TABLET | Freq: Once | ORAL | Status: AC
  Administered 2013-07-18: 500 mg via ORAL

## 2013-07-18 MED ORDER — LIDOCAINE HCL 2 % EX GEL
CUTANEOUS | Status: AC
  Administered 2013-07-18: 02:00:00
  Filled 2013-07-18: qty 10

## 2013-07-18 MED ORDER — DIAZEPAM 5 MG/ML IJ SOLN
5.0000 mg | Freq: Once | INTRAMUSCULAR | Status: AC
  Administered 2013-07-18: 5 mg via INTRAVENOUS

## 2013-07-18 MED ORDER — DIAZEPAM 5 MG/ML IJ SOLN
INTRAMUSCULAR | Status: AC
  Filled 2013-07-18: qty 2

## 2013-07-18 MED ORDER — HYDROMORPHONE HCL PF 1 MG/ML IJ SOLN
1.0000 mg | Freq: Once | INTRAMUSCULAR | Status: DC
  Filled 2013-07-18: qty 1

## 2013-07-18 MED ORDER — TAMSULOSIN HCL 0.4 MG PO CAPS
0.4000 mg | ORAL_CAPSULE | Freq: Every day | ORAL | Status: DC
  Filled 2013-07-18: qty 1

## 2013-07-18 MED ORDER — ONDANSETRON HCL 4 MG/2ML IJ SOLN
4.0000 mg | Freq: Once | INTRAMUSCULAR | Status: AC
  Administered 2013-07-18: 4 mg via INTRAVENOUS

## 2013-07-18 MED ORDER — FINASTERIDE 5 MG PO TABS
5.0000 mg | ORAL_TABLET | Freq: Every day | ORAL | Status: DC
  Administered 2013-07-18: 5 mg via ORAL
  Filled 2013-07-18: qty 1

## 2013-07-18 MED ORDER — FENTANYL CITRATE 0.05 MG/ML IJ SOLN
INTRAMUSCULAR | Status: AC
  Filled 2013-07-18: qty 2

## 2013-07-18 MED ORDER — KCL IN DEXTROSE-NACL 20-5-0.45 MEQ/L-%-% IV SOLN
INTRAVENOUS | Status: DC
  Administered 2013-07-18: 10:00:00 via INTRAVENOUS
  Filled 2013-07-18 (×2): qty 1000

## 2013-07-18 NOTE — ED Notes (Signed)
Pt's foley was flushed with of NaCl and of bloody urine was collected in pt's foley bag.  Pt currently resting comfortably and does not want dose of dilaudid.

## 2013-07-18 NOTE — Discharge Instructions (Signed)
Foley Catheter Care, Adult  A soft, flexible tube (Foley catheter) has been placed in your bladder. This may be done to temporarily help with urine drainage after an operation or to relieve blockage from an enlarged prostate gland.  HOME CARE INSTRUCTIONS   If you are going home with a Foley catheter in place, follow these instructions:  Taking Care of the Catheter:   Keep the area where the catheter leaves your body clean.   Attach the catheter to the leg so there is no tension on the catheter.   Keep the drainage bag below the level of the bladder, but keep it OFF the floor.   Do not take long soaking baths. Your caregiver will give instructions about showering.   Wash your hands before touching ANYTHING related to the catheter or bag.   Using mild soap and warm water on a washcloth:   Clean the area closest to the catheter insertion site using a circular motion around the catheter.   Clean the catheter itself by wiping AWAY from the insertion site for several inches down the tube.   NEVER wipe upward as this could sweep bacteria up into the urethra (tube in your body that normally drains the bladder) and cause infection.  Taking Care of the Drainage Bags:   Two drainage bags will be taken home: a large overnight drainage bag, and a smaller leg bag which fits underneath clothing.   It is okay to wear the overnight bag at any time, but NEVER wear the smaller leg bag at night.   Keep the drainage bag well below the level of your bladder. This prevents backflow of urine into the bladder and allows the urine to drain freely.   Anchor the tubing to your leg to prevent pulling or tension on the catheter. Use tape or a leg strap provided by the hospital.   Empty the drainage bag when it is  to  full. Wash your hands before and after touching the bag.   Periodically check the tubing for kinks to make sure there is no pressure on the tubing which could restrict the flow of urine.  Changing the Drainage  Bags:   Cleanse both ends of the clean bag with alcohol before changing.   Pinch off the rubber catheter to avoid urine spillage during the disconnection.   Disconnect the dirty bag and connect the clean one.   Empty the dirty bag carefully to avoid a urine spill.   Attach the new bag to the leg with tape or a leg strap.  Cleaning the Drainage Bags:   Whenever a drainage bag is disconnected, it must be cleaned quickly so it is ready for the next use.   Wash the bag in warm, soapy water.   Rinse the bag thoroughly with warm water.   Soak the bag for 30 minutes in a solution of white vinegar and water (1 cup vinegar to 1 quart warm water).   Rinse with warm water.  SEEK MEDICAL CARE IF:    Some pain develops in the kidney (lower back) area.   The urine is cloudy or smells bad.   There is some blood in the urine.   The catheter becomes clogged and/or there is no urine drainage.  SEEK IMMEDIATE MEDICAL CARE IF:    You have moderate or severe pain in the kidney region.   You start to throw up (vomit).   Blood fills the tube.   Worsening belly (abdominal) pain develops.     Patient Information 2014 [place].

## 2013-07-18 NOTE — Progress Notes (Signed)
Discharge instructions explained, pt verbalizes understanding of same. States does foley care at home x 1 month. Stable on discharge.

## 2013-07-18 NOTE — Progress Notes (Signed)
Patient ID: Lee Collins, male   DOB: [birth year], 67 y.o.   MRN: [id number]  Patient has had no further passage of clots or bleeding around the catheter. He admits to me that he saw his orthopedic doctor last week who cleared him to begin jogging again. Therefore he did exercises, weight lifting and jogging on Sunday. Not surprisingly he began to have gross hematuria on Monday.  Physical exam: Patient in no acute distress eating large Abdomen is soft and nontender, bladder not distended Urine is light pink  Procedure: I irrigated the Foley with a liter of water and flushed out some small old clots. The irrigation was clear and there was no ongoing bleeding.  Impression: BPH Gross hematuria  Plan: We'll monitor for a couple of more hours and if stable discharge. I discussed with him the nature risks and benefits of continued medical management or proceeding to the operating room for TURP or greenlight. He will consider. He does not need anything urgent at this point. We talked about the risk again of an outlet procedure such as retrograde ejaculation, impotence and incontinence.

## 2013-07-18 NOTE — H&P (Signed)
Lee Collins is an 67 y.o. male.   Chief Complaint: Gross hematuria with ongoing urinary retention and severe bladder spasms HPI: 67 year old male patient of Dr. Karma Greaser with ongoing urinary retention and a known history of BPH and elevated PSA. In the past Lee Collins. breast is been documented to have a prostate of well over 100 g. In August of this year the patient had a consultation from urology due to difficulty inserting the catheter with urinary retention after a Nissen fundoplication. He has failed multiple voiding trials. He is also had some intermittent gross hematuria presumptively secondary to BPH. He was last seen in our office approximately 10 days ago. He underwent a cystoscopy. This did show visual obstruction of the prostatic urethra. A new catheter was placed. He has been on Flomax as well as Avodart. He was due to come back to the office in approximately a week for another voiding trial and then ongoing discussions about treatment options. He presents with severe gross hematuria and severe bladder spasms. The ER has attempted to irrigate him on numerous occasions but continues to have obstruction of his catheter and ongoing severe bladder spasms.  I found him to be in moderate to severe distress. Grossly bloody urine in the catheter as well as around the catheter. Despite the relatively small catheter I was able to your gated approximately 500 cc of dark clots out of the bladder. The catheter then irrigated very nicely and the urine remained a light to medium pink color.  Past Medical History  Diagnosis Date  . Hiatal hernia     Past Surgical History  Procedure Laterality Date  . Appendectomy    . Left elbow surgery    . Vocal cord surgery    . Right rotator cuff repair    . Tonsillectomy    . Laparoscopic nissen fundoplication N/A 06/15/2013    Procedure: LAPAROSCOPIC PARAESOPHAGEAL HERNIA  ;  Surgeon: Ardeth Sportsman, MD;  Location: [facility];  Service: General;  Laterality:  N/A;  . Hernia repair      History reviewed. No pertinent family history. Social History:  reports that he has never smoked. He has never used smokeless tobacco. He reports that he does not drink alcohol or use illicit drugs.  Allergies:  Allergies  Allergen Reactions  . Latex Rash  . Betadine [Povidone Iodine] Rash  . Penicillins Rash     (Not in a hospital admission)  Results for orders placed during the hospital encounter of 07/18/13 (from the past 48 hour(s))  POCT I-STAT, CHEM 8     Status: Abnormal   Collection Time    07/18/13  2:13 AM      Result Value Range   Sodium 134 (*) 135 - 145 mEq/Collins   Potassium 3.8  3.5 - 5.1 mEq/Collins   Chloride 98  96 - 112 mEq/Collins   BUN 8  6 - 23 mg/dL   Creatinine, Ser 1.61  0.50 - 1.35 mg/dL   Glucose, Bld 096 (*) 70 - 99 mg/dL   Calcium, Ion 0.45  4.09 - 1.30 mmol/Collins   TCO2 25  0 - 100 mmol/Collins   Hemoglobin 12.2 (*) 13.0 - 17.0 g/dL   HCT 81.1 (*) 91.4 - 78.2 %  URINALYSIS, ROUTINE W REFLEX MICROSCOPIC     Status: Abnormal   Collection Time    07/18/13  2:24 AM      Result Value Range   Color, Urine RED (*) YELLOW   Comment: BIOCHEMICALS MAY BE  AFFECTED BY COLOR   APPearance CLOUDY (*) CLEAR   Specific Gravity, Urine 1.030  1.005 - 1.030   pH 5.0  5.0 - 8.0   Glucose, UA 100 (*) NEGATIVE mg/dL   Hgb urine dipstick LARGE (*) NEGATIVE   Bilirubin Urine LARGE (*) NEGATIVE   Ketones, ur 40 (*) NEGATIVE mg/dL   Protein, ur >161 (*) NEGATIVE mg/dL   Urobilinogen, UA 0.2  0.0 - 1.0 mg/dL   Nitrite POSITIVE (*) NEGATIVE   Leukocytes, UA LARGE (*) NEGATIVE  URINE MICROSCOPIC-ADD ON     Status: Abnormal   Collection Time    07/18/13  2:24 AM      Result Value Range   WBC, UA FIELD OBSCURED BY RBC'S  <3 WBC/hpf   RBC / HPF TOO NUMEROUS TO COUNT  <3 RBC/hpf   Bacteria, UA FIELD OBSCURED BY RBC'S (*) RARE   Urine-Other URINALYSIS PERFORMED ON SUPERNATANT     Comment: MICROSCOPIC EXAM PERFORMED ON UNCONCENTRATED URINE   No results  found.  Review of Systems - Negative except significant lower abdominal/suprapubic pain, penile discomfort/pressure, gross hematuria and fatigue. Blood pressure 132/70, pulse 68, temperature 97.8 F (36.6 C), temperature source Oral, resp. rate 18, weight 68.04 kg (150 lb), SpO2 98.00%. General appearance: alert, cooperative and no distress Neck: no adenopathy and no JVD Resp: clear to auscultation bilaterally Cardio: regular rate and rhythm GI: soft, non-tender; bowel sounds normal; no masses,  no organomegaly Male genitalia: normal, penis: Indwelling Foley catheter draining bloody urine with blood around catheter. Scrotal contents unremarkable. Extremities: extremities normal, atraumatic, no cyanosis or edema Skin: Skin color, texture, turgor normal. No rashes or lesions Neurologic: Grossly normal  Assessment/Plan Gross hematuria presumptively secondary to BPH with some Foley trauma and potential bacteriuria. I was planning on changing into a three-way continuous irrigation catheter but I was able to clear all the clots through his existing catheter. Urine is now a light to medium pink color. Given the extensive irrigations through the night I do think he would be best for the patient to be observed 24 hours to be sure that the hematuria improved so more urgent procedures not required. If the bleeding persists and worked he develops additional clot retention I would exchange his catheter for a three-way 22-24 [place] catheter.  Amier Hoyt S 07/18/2013, 6:46 AM

## 2013-07-18 NOTE — Care Management Note (Signed)
    Page 1 of 1   07/18/2013     12:40:56 PM   CARE MANAGEMENT NOTE 07/18/2013  Patient:  Lee Collins, Lee Collins   Account Number:  [id number]  Date Initiated:  07/18/2013  Documentation initiated by:  Lanier Clam  Subjective/Objective Assessment:   67 Y/O M ADMITTED W/ACUTE URINARY RETENTION.     Action/Plan:   FROM HOME.   Anticipated DC Date:  07/18/2013   Anticipated DC Plan:  HOME/SELF CARE      DC Planning Services  CM consult      Choice offered to / List presented to:             Status of service:  In process, will continue to follow Medicare Important Message given?   (If response is "NO", the following Medicare IM given date fields will be blank) Date Medicare IM given:   Date Additional Medicare IM given:    Discharge Disposition:    Per UR Regulation:  Reviewed for med. necessity/level of care/duration of stay  If discussed at Long Length of Stay Meetings, dates discussed:    Comments:  07/18/13 [facility] RN,BSN NCM [phone]

## 2013-07-18 NOTE — ED Provider Notes (Signed)
CSN: [id number]     Arrival date & time 07/17/13  2328 History   First MD Initiated Contact with Patient 07/18/13 0123     Chief Complaint  Patient presents with  . Urinary Retention   (Consider location/radiation/quality/duration/timing/severity/associated sxs/prior Treatment) HPI History provided by patient. Has been wearing a Foley for the last month since having surgery. Followed by urology Dr. Mena Goes. Tonight at home developed urinary retention not resolved with attempted flushing of catheter.  He presents here with suprapubic discomfort and "spasms". Pain moderate to severe. Patient requesting replace Foley. No recent fevers.  No recent constipation or illness. He has had some intermittent hematuria but denies any recently. Past Medical History  Diagnosis Date  . Hiatal hernia    Past Surgical History  Procedure Laterality Date  . Appendectomy    . Left elbow surgery    . Vocal cord surgery    . Right rotator cuff repair    . Tonsillectomy    . Laparoscopic nissen fundoplication N/A 06/15/2013    Procedure: LAPAROSCOPIC PARAESOPHAGEAL HERNIA  ;  Surgeon: Ardeth Sportsman, MD;  Location: [facility];  Service: General;  Laterality: N/A;  . Hernia repair     History reviewed. No pertinent family history. History  Substance Use Topics  . Smoking status: Never Smoker   . Smokeless tobacco: Never Used  . Alcohol Use: No    Review of Systems  Constitutional: Negative for fever and chills.  Respiratory: Negative for shortness of breath.   Cardiovascular: Negative for chest pain.  Gastrointestinal: Negative for vomiting and constipation.  Genitourinary: Positive for difficulty urinating.  Musculoskeletal: Negative for back pain.  Skin: Negative for rash.  Neurological: Negative for headaches.  All other systems reviewed and are negative.    Allergies  Latex; Betadine; and Penicillins  Home Medications   Current Outpatient Rx  Name  Route  Sig  Dispense  Refill  . aspirin  EC 81 MG tablet   Oral   Take 81 mg by mouth every other day.          . finasteride (PROSCAR) 5 MG tablet   Oral   Take 5 mg by mouth daily.          . tamsulosin (FLOMAX) 0.4 MG CAPS capsule   Oral   Take 0.4 mg by mouth daily after supper.          . trimethoprim (TRIMPEX) 100 MG tablet   Oral   Take 100 mg by mouth daily.           BP 157/81  Pulse 71  Temp(Src) 98.5 F (36.9 C) (Oral)  Resp 16  Wt 150 lb (68.04 kg)  BMI 22.47 kg/m2  SpO2 100% Physical Exam  Constitutional: He is oriented to person, place, and time. He appears well-developed and well-nourished.  HENT:  Head: Normocephalic and atraumatic.  Eyes: EOM are normal. Pupils are equal, round, and reactive to light.  Neck: Neck supple.  Cardiovascular: Normal rate, regular rhythm and intact distal pulses.   Pulmonary/Chest: Effort normal and breath sounds normal. No respiratory distress.  Abdominal:  Midline suprapubic fullness and tenderness, otherwise nontender throughout  Genitourinary:  External GU normal with Foley and place  Musculoskeletal: Normal range of motion. He exhibits no edema.  Neurological: He is alert and oriented to person, place, and time.  Skin: Skin is warm and dry.    ED Course  Procedures (including critical care time) Labs Review Labs Reviewed  URINALYSIS, ROUTINE W REFLEX  MICROSCOPIC - Abnormal; Notable for the following:    Color, Urine RED (*)    APPearance CLOUDY (*)    Glucose, UA 100 (*)    Hgb urine dipstick LARGE (*)    Bilirubin Urine LARGE (*)    Ketones, ur 40 (*)    Protein, ur >300 (*)    Nitrite POSITIVE (*)    Leukocytes, UA LARGE (*)    All other components within normal limits  URINE MICROSCOPIC-ADD ON - Abnormal; Notable for the following:    Bacteria, UA FIELD OBSCURED BY RBC'S (*)    All other components within normal limits  POCT I-STAT, CHEM 8 - Abnormal; Notable for the following:    Sodium 134 (*)    Glucose, Bld 120 (*)    Hemoglobin  12.2 (*)    HCT 36.0 (*)    All other components within normal limits   Foley replaced and bloody UOP about 200cc, then unable to irrigate/ flush after multiple attempts. PT with severe bladder spasms despite IV narcotics and valium.   5:24 AM R Grapey to evaluate bedside.  He was able to aspirate clots and flush to clear.  Plan admit.  MDM  Diagnosis: Urinary retention/clogged Foley catheter UA Foley replaced IV narctoics Urology admit    Sunnie Nielsen, MD 07/18/13 [phone]

## 2013-07-19 NOTE — Discharge Summary (Signed)
Physician Discharge Summary  Patient ID: Lee Collins MRN: [id number] DOB/AGE: 11-10-1945 67 y.o.  Admit date: 07/18/2013 Discharge date: 07/19/2013  Admission Diagnoses: BPH Gross hematuria  Discharge Diagnoses:  BPH Gross hematuria  Discharged Condition: Good  Hospital Course: He was admitted for observation. He had no further bleeding. I irrigated foley and it returned clear. No0 further bleeding. He was instructed not to jog while foley in place.   Consults: none  Significant Diagnostic Studies: none  Treatments: bladder irrigation  Discharge Exam: Blood pressure 110/51, pulse 67, temperature 97.9 F (36.6 C), temperature source Oral, resp. rate 18, height 5\' 8"  (1.727 m), weight 66.7 kg (147 lb 0.8 oz), SpO2 100.00%. Urine very light pink , no clots  Disposition: 01-Home or Self Care   Future Appointments Provider Department Dept Phone   08/06/2013 9:15 AM Ardeth Sportsman, MD [facility], [place] [phone]       Medication List         aspirin EC 81 MG tablet  Take 1 tablet (81 mg total) by mouth every other day.  Start taking on:  07/23/2013     finasteride 5 MG tablet  Commonly known as:  PROSCAR  Take 5 mg by mouth daily.     tamsulosin 0.4 MG Caps capsule  Commonly known as:  FLOMAX  Take 0.4 mg by mouth daily after supper.     trimethoprim 100 MG tablet  Commonly known as:  TRIMPEX  Take 100 mg by mouth daily.           Follow-up Information   Follow up with Ridhaan Dreibelbis, Lowella Petties, MD. (keep future office appointment)    Specialty:  Urology   Contact information:   [address] [place] [address] [phone]       Signed: Antony Haste 07/19/2013, 11:30 AM

## 2013-07-21 ENCOUNTER — Encounter (HOSPITAL_COMMUNITY): Payer: Self-pay | Admitting: Anesthesiology

## 2013-07-21 ENCOUNTER — Encounter (HOSPITAL_COMMUNITY)
Admission: EM | Disposition: A | Discharge status: Home / Self Care | Payer: Self-pay | Source: Home / Self Care | Attending: Emergency Medicine

## 2013-07-21 ENCOUNTER — Observation Stay (HOSPITAL_COMMUNITY)
Admission: EM | Admit: 2013-07-21 | Discharge: 2013-07-22 | Disposition: A | Discharge status: Home / Self Care | Payer: BC Managed Care – PPO | Attending: Urology | Admitting: Urology

## 2013-07-21 ENCOUNTER — Encounter (HOSPITAL_COMMUNITY): Payer: Self-pay | Admitting: *Deleted

## 2013-07-21 ENCOUNTER — Observation Stay (HOSPITAL_COMMUNITY): Payer: BC Managed Care – PPO | Admitting: Anesthesiology

## 2013-07-21 DIAGNOSIS — R310 Gross hematuria: Secondary | ICD-10-CM | POA: Insufficient documentation

## 2013-07-21 DIAGNOSIS — N138 Other obstructive and reflux uropathy: Principal | ICD-10-CM | POA: Insufficient documentation

## 2013-07-21 DIAGNOSIS — N401 Enlarged prostate with lower urinary tract symptoms: Secondary | ICD-10-CM | POA: Insufficient documentation

## 2013-07-21 DIAGNOSIS — R42 Dizziness and giddiness: Secondary | ICD-10-CM | POA: Insufficient documentation

## 2013-07-21 DIAGNOSIS — R319 Hematuria, unspecified: Secondary | ICD-10-CM

## 2013-07-21 DIAGNOSIS — R339 Retention of urine, unspecified: Secondary | ICD-10-CM | POA: Insufficient documentation

## 2013-07-21 DIAGNOSIS — N3289 Other specified disorders of bladder: Secondary | ICD-10-CM | POA: Insufficient documentation

## 2013-07-21 DIAGNOSIS — R110 Nausea: Secondary | ICD-10-CM | POA: Insufficient documentation

## 2013-07-21 HISTORY — PX: TRANSURETHRAL RESECTION OF PROSTATE: SHX73

## 2013-07-21 LAB — CBC WITH DIFFERENTIAL/PLATELET
Eosinophils Absolute: 0.1 10*3/uL (ref 0.0–0.7)
HCT: 24.7 % — ABNORMAL LOW (ref 39.0–52.0)
Hemoglobin: 8.9 g/dL — ABNORMAL LOW (ref 13.0–17.0)
Lymphs Abs: 0.5 10*3/uL — ABNORMAL LOW (ref 0.7–4.0)
MCH: 33.2 pg (ref 26.0–34.0)
MCHC: 36 g/dL (ref 30.0–36.0)
Monocytes Relative: 2 % — ABNORMAL LOW (ref 3–12)
Neutrophils Relative %: 91 % — ABNORMAL HIGH (ref 43–77)
RBC: 2.68 MIL/uL — ABNORMAL LOW (ref 4.22–5.81)

## 2013-07-21 LAB — POCT I-STAT, CHEM 8
Calcium, Ion: 1.15 mmol/L (ref 1.13–1.30)
Creatinine, Ser: 1 mg/dL (ref 0.50–1.35)
Glucose, Bld: 113 mg/dL — ABNORMAL HIGH (ref 70–99)
HCT: 31 % — ABNORMAL LOW (ref 39.0–52.0)
Hemoglobin: 10.5 g/dL — ABNORMAL LOW (ref 13.0–17.0)
Sodium: 134 mEq/L — ABNORMAL LOW (ref 135–145)
TCO2: 24 mmol/L (ref 0–100)

## 2013-07-21 LAB — CBC
MCHC: 36.6 g/dL — ABNORMAL HIGH (ref 30.0–36.0)
MCV: 91.8 fL (ref 78.0–100.0)
Platelets: 256 10*3/uL (ref 150–400)
RBC: 3.3 MIL/uL — ABNORMAL LOW (ref 4.22–5.81)
RDW: 11.7 % (ref 11.5–15.5)
WBC: 4.6 10*3/uL (ref 4.0–10.5)

## 2013-07-21 LAB — PROTIME-INR
INR: 1.26 (ref 0.00–1.49)
Prothrombin Time: 15.5 seconds — ABNORMAL HIGH (ref 11.6–15.2)

## 2013-07-21 LAB — PREPARE RBC (CROSSMATCH)

## 2013-07-21 LAB — ABO/RH: ABO/RH(D): O POS

## 2013-07-21 LAB — SURGICAL PCR SCREEN: Staphylococcus aureus: NEGATIVE

## 2013-07-21 SURGERY — TURP (TRANSURETHRAL RESECTION OF PROSTATE)
Anesthesia: General | Site: Prostate | Wound class: Clean Contaminated

## 2013-07-21 MED ORDER — SODIUM CHLORIDE 0.9 % IR SOLN
3000.0000 mL | Status: DC
  Administered 2013-07-21: 3000 mL

## 2013-07-21 MED ORDER — EPHEDRINE SULFATE 50 MG/ML IJ SOLN
INTRAMUSCULAR | Status: DC | PRN
  Administered 2013-07-21 (×2): 5 mg via INTRAVENOUS

## 2013-07-21 MED ORDER — LIDOCAINE HCL (CARDIAC) 20 MG/ML IV SOLN
INTRAVENOUS | Status: DC | PRN
  Administered 2013-07-21: 75 mg via INTRAVENOUS

## 2013-07-21 MED ORDER — ONDANSETRON HCL 4 MG/2ML IJ SOLN
4.0000 mg | Freq: Once | INTRAMUSCULAR | Status: AC
  Administered 2013-07-21: 4 mg via INTRAVENOUS
  Filled 2013-07-21: qty 2

## 2013-07-21 MED ORDER — DEXAMETHASONE SODIUM PHOSPHATE 10 MG/ML IJ SOLN
INTRAMUSCULAR | Status: DC | PRN
  Administered 2013-07-21: 10 mg via INTRAVENOUS

## 2013-07-21 MED ORDER — DIAZEPAM 5 MG PO TABS
5.0000 mg | ORAL_TABLET | Freq: Once | ORAL | Status: AC
  Administered 2013-07-21: 5 mg via ORAL
  Filled 2013-07-21: qty 1

## 2013-07-21 MED ORDER — FENTANYL CITRATE 0.05 MG/ML IJ SOLN: 25.0000 ug | INTRAMUSCULAR | Status: DC | PRN

## 2013-07-21 MED ORDER — SENNA 8.6 MG PO TABS
1.0000 | ORAL_TABLET | Freq: Two times a day (BID) | ORAL | Status: DC
  Administered 2013-07-21 – 2013-07-22 (×3): 8.6 mg via ORAL
  Filled 2013-07-21 (×3): qty 1

## 2013-07-21 MED ORDER — PROPOFOL 10 MG/ML IV BOLUS
INTRAVENOUS | Status: DC | PRN
  Administered 2013-07-21: 50 mg via INTRAVENOUS
  Administered 2013-07-21: 150 mg via INTRAVENOUS

## 2013-07-21 MED ORDER — MIDAZOLAM HCL 5 MG/5ML IJ SOLN
INTRAMUSCULAR | Status: DC | PRN
  Administered 2013-07-21 (×2): 1 mg via INTRAVENOUS

## 2013-07-21 MED ORDER — SODIUM CHLORIDE 0.9 % IV SOLN
INTRAVENOUS | Status: DC | PRN
  Administered 2013-07-21 (×2): via INTRAVENOUS

## 2013-07-21 MED ORDER — DOCUSATE SODIUM 100 MG PO CAPS
100.0000 mg | ORAL_CAPSULE | Freq: Two times a day (BID) | ORAL | Status: DC
  Filled 2013-07-21 (×2): qty 1

## 2013-07-21 MED ORDER — SODIUM CHLORIDE 0.9 % IV SOLN
INTRAVENOUS | Status: DC
  Administered 2013-07-21: 16:00:00 via INTRAVENOUS

## 2013-07-21 MED ORDER — FINASTERIDE 5 MG PO TABS
5.0000 mg | ORAL_TABLET | Freq: Every day | ORAL | Status: DC
  Administered 2013-07-21 – 2013-07-22 (×2): 5 mg via ORAL
  Filled 2013-07-21 (×2): qty 1

## 2013-07-21 MED ORDER — OXYCODONE-ACETAMINOPHEN 5-325 MG PO TABS: 1.0000 | ORAL_TABLET | ORAL | Status: DC | PRN

## 2013-07-21 MED ORDER — SODIUM CHLORIDE 0.9 % IR SOLN
Status: DC | PRN
  Administered 2013-07-21 (×20): 3000 mL via INTRAVESICAL

## 2013-07-21 MED ORDER — OXYBUTYNIN CHLORIDE 5 MG PO TABS
5.0000 mg | ORAL_TABLET | Freq: Three times a day (TID) | ORAL | Status: DC | PRN
  Filled 2013-07-21: qty 1

## 2013-07-21 MED ORDER — HYDROMORPHONE HCL PF 1 MG/ML IJ SOLN: 0.5000 mg | INTRAMUSCULAR | Status: DC | PRN

## 2013-07-21 MED ORDER — PROMETHAZINE HCL 25 MG/ML IJ SOLN: 6.2500 mg | INTRAMUSCULAR | Status: DC | PRN

## 2013-07-21 MED ORDER — ONDANSETRON HCL 4 MG/2ML IJ SOLN
INTRAMUSCULAR | Status: DC | PRN
  Administered 2013-07-21 (×2): 2 mg via INTRAVENOUS

## 2013-07-21 MED ORDER — ONDANSETRON HCL 4 MG/2ML IJ SOLN
4.0000 mg | INTRAMUSCULAR | Status: DC | PRN
  Administered 2013-07-21 – 2013-07-22 (×2): 4 mg via INTRAVENOUS
  Filled 2013-07-21 (×2): qty 2

## 2013-07-21 MED ORDER — HYDROMORPHONE HCL PF 1 MG/ML IJ SOLN
1.0000 mg | Freq: Once | INTRAMUSCULAR | Status: AC
  Administered 2013-07-21: 1 mg via INTRAVENOUS
  Filled 2013-07-21: qty 1

## 2013-07-21 MED ORDER — FENTANYL CITRATE 0.05 MG/ML IJ SOLN
INTRAMUSCULAR | Status: DC | PRN
  Administered 2013-07-21 (×10): 50 ug via INTRAVENOUS

## 2013-07-21 MED ORDER — GENTAMICIN SULFATE 40 MG/ML IJ SOLN
5.0000 mg/kg | Freq: Every day | INTRAVENOUS | Status: DC
  Administered 2013-07-21: 330 mg via INTRAVENOUS
  Filled 2013-07-21: qty 8.25

## 2013-07-21 MED ORDER — GENTAMICIN SULFATE 40 MG/ML IJ SOLN
5.0000 mg/kg | Freq: Every day | INTRAVENOUS | Status: AC
  Administered 2013-07-22: 330 mg via INTRAVENOUS
  Filled 2013-07-21: qty 8.25

## 2013-07-21 MED ORDER — DOCUSATE SODIUM 50 MG/5ML PO LIQD
100.0000 mg | Freq: Two times a day (BID) | ORAL | Status: DC
  Administered 2013-07-21 – 2013-07-22 (×2): 100 mg via ORAL
  Filled 2013-07-21 (×5): qty 10

## 2013-07-21 MED ORDER — LACTATED RINGERS IV SOLN
INTRAVENOUS | Status: DC | PRN
  Administered 2013-07-21 (×2): via INTRAVENOUS

## 2013-07-21 SURGICAL SUPPLY — 22 items
BAG URINE DRAINAGE (UROLOGICAL SUPPLIES) ×2 IMPLANT
BAG URO CATCHER STRL LF (DRAPE) ×2 IMPLANT
CATH FOLEY 3WAY 30CC 22FR (CATHETERS) ×2 IMPLANT
CATH SILICONE 22FR 30CC 3WAY (CATHETERS) ×2 IMPLANT
CLOTH BEACON ORANGE TIMEOUT ST (SAFETY) ×2 IMPLANT
DRAPE CAMERA CLOSED 9X96 (DRAPES) ×2 IMPLANT
ELECT BUTTON HF 24-28F 2 30DE (ELECTRODE) ×2 IMPLANT
ELECT LOOP MED HF 24F 12D (CUTTING LOOP) ×2 IMPLANT
ELECT LOOP MED HF 24F 12D CBL (CLIP) ×2 IMPLANT
ELECT RESECT VAPORIZE 12D CBL (ELECTRODE) ×2 IMPLANT
GLOVE BIOGEL M STRL SZ7.5 (GLOVE) ×2 IMPLANT
GOWN STRL REIN XL XLG (GOWN DISPOSABLE) ×2 IMPLANT
HOLDER FOLEY CATH W/STRAP (MISCELLANEOUS) ×4 IMPLANT
IV NS IRRIG 3000ML ARTHROMATIC (IV SOLUTION) ×40 IMPLANT
KIT ASPIRATION TUBING (SET/KITS/TRAYS/PACK) IMPLANT
MANIFOLD NEPTUNE II (INSTRUMENTS) ×2 IMPLANT
PACK CYSTO (CUSTOM PROCEDURE TRAY) ×2 IMPLANT
PLUG CATH AND CAP STER (CATHETERS) ×2 IMPLANT
SUT ETHILON 3 0 PS 1 (SUTURE) IMPLANT
SYR 30ML LL (SYRINGE) ×2 IMPLANT
SYRINGE IRR TOOMEY STRL 70CC (SYRINGE) ×2 IMPLANT
TUBING CONNECTING 10 (TUBING) ×2 IMPLANT

## 2013-07-21 NOTE — Progress Notes (Signed)
pacu nursing:  Lee Collins given an update as per dr. Emmaline Life request.  Surg should be about 30 more mins or so, otherwise everything going well.  Information acknowledge.  Lee Tunnell left cell phone number which I will give to Dr. Berneice Heinrich post op

## 2013-07-21 NOTE — Brief Op Note (Signed)
07/21/2013  1:17 PM  PATIENT:  Lee Collins  67 y.o. male  PRE-OPERATIVE DIAGNOSIS:  bleeding from penis, with clots  POST-OPERATIVE DIAGNOSIS:  bleeding from penis, with clots  PROCEDURE:  Procedure(s): TRANSURETHRAL RESECTION OF THE PROSTATE (TURP) with clot evacuation in bladder (N/A)  SURGEON:  Surgeon(s) and Role:    * Sebastian Ache, MD - Primary  PHYSICIAN ASSISTANT:   ASSISTANTS: none   ANESTHESIA:   general  EBL:  Total I/O In: 2300 [I.V.:2300] Out: 775 [Urine:775]  BLOOD ADMINISTERED:none  DRAINS: 37F 3 - way silicone catheter to NS irrigation   LOCAL MEDICATIONS USED:  NONE  SPECIMEN:  Source of Specimen:  1 - Prostate Chips  DISPOSITION OF SPECIMEN:  PATHOLOGY  COUNTS:  YES  TOURNIQUET:  * No tourniquets in log *  DICTATION: .Other Dictation: Dictation Number [id number]  PLAN OF CARE: Admit for overnight observation  PATIENT DISPOSITION:  PACU - hemodynamically stable.   Delay start of Pharmacological VTE agent (>24hrs) due to surgical blood loss or risk of bleeding: yes

## 2013-07-21 NOTE — H&P (Signed)
Lee Collins is an 67 y.o. male.    Chief Complaint: Recurrent Gross Hematuria With Clot Retention And Prostatic Hypertrophy  HPI:   1 - Recurrent Hematuria - Pt with recurrent episodes of gross hematuria with clots and clot retention sine 05/2013 after catheter at time of Nissen fundoplication. Most recently admitted 10/1 from ER with recurrent clot retention managed with bedside irrigation / 3- way catheter. Recent CT 05/2013 w/o upper tract lesions, just very large prostate.   2 - Prostatic Hyperplasia With Urinary Retention - Long h/o BPH. Negative prostate biopsy 2010. Volume 200gm by CT 05/2013. Moderate baseline LUTS with weak stream, hesitancy, straining, now in retention having failed several trials of void on maximal medical therapy with tamsulsoin and avodart since Nissen Fundoplication 05/2013. Has been in discussions recently with Dr. Mena Goes about TURP v. greenlight v. Simple prostatectomy.  Today [name] is seen as ER consult with recurrent clot retention. Grossly bloody urine present for weeks but now culminating tonight with clots and catheter blockage. Had bedside irrigation by ER staff with resolution of retention but urine still grossly bloody. No fevers / flank pain. He has been on trimethoprim prop daily.  No CV disease. No strong blood thinners. Pt avid runner.  Past Medical History  Diagnosis Date  . Hiatal hernia     Past Surgical History  Procedure Laterality Date  . Appendectomy    . Left elbow surgery    . Vocal cord surgery    . Right rotator cuff repair    . Tonsillectomy    . Laparoscopic nissen fundoplication N/A 06/15/2013    Procedure: LAPAROSCOPIC PARAESOPHAGEAL HERNIA  ;  Surgeon: Ardeth Sportsman, MD;  Location: [facility];  Service: General;  Laterality: N/A;  . Hernia repair      History reviewed. No pertinent family history. Social History:  reports that he has never smoked. He has never used smokeless tobacco. He reports that he does not drink alcohol or  use illicit drugs.  Allergies:  Allergies  Allergen Reactions  . Latex Rash  . Betadine [Povidone Iodine] Rash  . Penicillins Rash     (Not in a hospital admission)  Results for orders placed during the hospital encounter of 07/21/13 (from the past 48 hour(s))  CBC     Status: Abnormal   Collection Time    07/21/13 12:52 AM      Result Value Range   WBC 4.6  4.0 - 10.5 K/uL   RBC 3.30 (*) 4.22 - 5.81 MIL/uL   Hemoglobin 11.1 (*) 13.0 - 17.0 g/dL   HCT 16.1 (*) 09.6 - 04.5 %   MCV 91.8  78.0 - 100.0 fL   MCH 33.6  26.0 - 34.0 pg   MCHC 36.6 (*) 30.0 - 36.0 g/dL   RDW 40.9  81.1 - 91.4 %   Platelets 256  150 - 400 K/uL  POCT I-STAT, CHEM 8     Status: Abnormal   Collection Time    07/21/13  1:16 AM      Result Value Range   Sodium 134 (*) 135 - 145 mEq/L   Potassium 3.8  3.5 - 5.1 mEq/L   Chloride 96  96 - 112 mEq/L   BUN 11  6 - 23 mg/dL   Creatinine, Ser 7.82  0.50 - 1.35 mg/dL   Glucose, Bld 956 (*) 70 - 99 mg/dL   Calcium, Ion 2.13  0.86 - 1.30 mmol/L   TCO2 24  0 - 100 mmol/L  Hemoglobin 10.5 (*) 13.0 - 17.0 g/dL   HCT 95.6 (*) 21.3 - 08.6 %   No results found.  Review of Systems  Constitutional: Negative.  Negative for fever, chills and malaise/fatigue.  HENT: Negative.   Eyes: Negative.   Respiratory: Negative.   Cardiovascular: Negative.   Gastrointestinal: Negative for nausea, vomiting and abdominal pain.  Genitourinary: Positive for hematuria. Negative for dysuria, urgency, frequency and flank pain.  Musculoskeletal: Negative.   Skin: Negative.   Neurological: Negative.   Endo/Heme/Allergies: Negative.   Psychiatric/Behavioral: Negative.     Blood pressure 157/77, pulse 95, temperature 98.1 F (36.7 C), temperature source Oral, resp. rate 20, SpO2 100.00%. Physical Exam  Constitutional: He is oriented to person, place, and time. He appears well-developed and well-nourished.  Wife at bedside  HENT:  Head: Normocephalic and atraumatic.  Eyes:  EOM are normal. Pupils are equal, round, and reactive to light.  Neck: Normal range of motion. Neck supple.  Cardiovascular: Normal rate.   Respiratory: Effort normal.  GI: Soft. Bowel sounds are normal.  Recent port sites from Nissen well healed w/o hernias. Thin, non-obese.  Genitourinary: Penis normal.  Foley in place with dark bloody urine in collection bag, few clots  Musculoskeletal: Normal range of motion.  Neurological: He is alert and oriented to person, place, and time.  Skin: Skin is warm and dry.  Psychiatric: He has a normal mood and affect. His behavior is normal. Judgment and thought content normal.     Assessment/Plan  1 - Recurrent Hematuria - Likely recurrent prostatic fossa bleeding, now refractory. Discussed options of hematuria catheter with admission for bladder irrigation v. OR for cysto and clot evacuation / fulgeration v. OR for TURP with fulgeration with goal of hematuria and also prostatic hypertrophy / retention. Pt wants to proceed with TURP.  2 - Prostatic Hyperplasia With Urinary Retention - Medication refractory. Discussed outlet procedures as per above as well as simple prostatectomy (more definitive but more morbidity) and TURP as well as role of peri-operative urodynamics. Given situation he wants to proceed with TURP later today. I feel this is reasonable as he has no evidence of massive bladder distention or other s/s severe detrussor dysfunction making large prostate likely source of repeat obstruction and retention. I specifically mentioned at least 10% chance of NOT obtaining catheter-free status.IV Gent peri-op beginning now as he is likely colonized.  We discussed options for medical refractory prostatic outlet obstruction including TURP, TUNA, TUMT, Green-Light, Ho-LEP, and simple prostatectomy with their respective risks, benefits, and long-term outcomes data. Pt has opted for TURP. We discussed the typical peri-operative course with overnight admission  and discharge with foley in place with subsequent office voiding trial few days later. We discussed risks including bleeding, infection, incontinence, need for repeat procedures / tissue regrowth over time as well as rare risks including DVT, PE, MI, CVA and mortality.  Plan for TURP today at about 9am. Admit prior, NPO, IVF, ABX.  Nameer Summer 07/21/2013, 3:52 AM

## 2013-07-21 NOTE — Anesthesia Procedure Notes (Signed)
Procedure Name: LMA Insertion Date/Time: 07/21/2013 10:10 AM Performed by: Edison Pace Pre-anesthesia Checklist: Patient identified, Timeout performed, Emergency Drugs available, Suction available and Patient being monitored Patient Re-evaluated:Patient Re-evaluated prior to inductionOxygen Delivery Method: Circle system utilized Preoxygenation: Pre-oxygenation with 100% oxygen Intubation Type: IV induction LMA: LMA with gastric port inserted LMA Size: 4.0 Number of attempts: 1 Placement Confirmation: positive ETCO2 Tube secured with: Tape Dental Injury: Teeth and Oropharynx as per pre-operative assessment

## 2013-07-21 NOTE — ED Provider Notes (Signed)
CSN: [id number]     Arrival date & time 07/21/13  0001 History   First MD Initiated Contact with Patient 07/21/13 0017     Chief Complaint  Patient presents with  . Dysuria   (Consider location/radiation/quality/duration/timing/severity/associated sxs/prior Treatment) HPI HX per PT - HAs indwelling foley with persistent hematuria for the last few days.  Was admitted to the hospital 2 nights ago for the same presenting symptoms of severe bladder spasms and hematuria with urinary retention. Pain sharp and severe, has taken home mediations without relief, presents here requesting immediate Urology evaluation.   Past Medical History  Diagnosis Date  . Hiatal hernia    Past Surgical History  Procedure Laterality Date  . Appendectomy    . Left elbow surgery    . Vocal cord surgery    . Right rotator cuff repair    . Tonsillectomy    . Laparoscopic nissen fundoplication N/A 06/15/2013    Procedure: LAPAROSCOPIC PARAESOPHAGEAL HERNIA  ;  Surgeon: Ardeth Sportsman, MD;  Location: [facility];  Service: General;  Laterality: N/A;  . Hernia repair     History reviewed. No pertinent family history. History  Substance Use Topics  . Smoking status: Never Smoker   . Smokeless tobacco: Never Used  . Alcohol Use: No    Review of Systems  Constitutional: Negative for fever and chills.  HENT: Negative for neck pain.   Respiratory: Negative for shortness of breath.   Cardiovascular: Negative for chest pain.  Gastrointestinal: Positive for abdominal pain. Negative for vomiting.  Genitourinary: Positive for hematuria.  Musculoskeletal: Negative for back pain.  Skin: Negative for rash.  Neurological: Negative for headaches.  All other systems reviewed and are negative.    Allergies  Latex; Betadine; and Penicillins  Home Medications   Current Outpatient Rx  Name  Route  Sig  Dispense  Refill  . aspirin EC 81 MG tablet   Oral   Take 1 tablet (81 mg total) by mouth every other day.         . finasteride (PROSCAR) 5 MG tablet   Oral   Take 5 mg by mouth daily.          . tamsulosin (FLOMAX) 0.4 MG CAPS capsule   Oral   Take 0.4 mg by mouth daily after supper.          . trimethoprim (TRIMPEX) 100 MG tablet   Oral   Take 100 mg by mouth daily.           BP 157/77  Pulse 95  Temp(Src) 98.1 F (36.7 C) (Oral)  Resp 20  SpO2 100% Physical Exam  Constitutional: He is oriented to person, place, and time. He appears well-developed and well-nourished.  HENT:  Head: Normocephalic and atraumatic.  Eyes: EOM are normal. Pupils are equal, round, and reactive to light.  Neck: Neck supple.  Cardiovascular: Normal rate, regular rhythm and intact distal pulses.   Pulmonary/Chest: Effort normal and breath sounds normal. No respiratory distress.  Abdominal: Soft. He exhibits no distension. There is no rebound and no guarding.  Tender over suprapubic region with fullness and obvious hematuria in leg bag. No CVAT  Musculoskeletal: Normal range of motion. He exhibits no edema.  Neurological: He is alert and oriented to person, place, and time.  Skin: Skin is warm and dry.    ED Course  Procedures (including critical care time) Labs Review Labs Reviewed  CBC - Abnormal; Notable for the following:    RBC 3.30 (*)  Hemoglobin 11.1 (*)    HCT 30.3 (*)    MCHC 36.6 (*)    All other components within normal limits  POCT I-STAT, CHEM 8 - Abnormal; Notable for the following:    Sodium 134 (*)    Glucose, Bld 113 (*)    Hemoglobin 10.5 (*)    HCT 31.0 (*)    All other components within normal limits    1:28 AM been able to get a large amount of clots out of his Foley, approximately 800 cc of bloody urine output with lots of clots. Some intermittent symptomatic improvement with IV Dilaudid and irrigation, but has persistent hematuria and I am concerned about foley re-clogging with clots. Urology consulted/  evaluated bedside and will admit MDM  Diagnosis: Bladder spasm,  hematuria causing urinary retention  Labs as above IV narcotics pain control Urology admit   Sunnie Nielsen, MD 07/21/13 0430

## 2013-07-21 NOTE — ED Notes (Signed)
Dr Manny at bedside  

## 2013-07-21 NOTE — Progress Notes (Signed)
Patient back  post TRUP, alert and oriented, C/O abd cramping but not pain per patient, heating pack helping, did not want pain medication. 3way foley intact with CBI, output redish and no clot noted. In bed resting. Will continue to assess patient.

## 2013-07-21 NOTE — Anesthesia Preprocedure Evaluation (Signed)
Anesthesia Evaluation  Patient identified by MRN, date of birth, ID band Patient awake    Reviewed: Allergy & Precautions, H&P , NPO status , Patient's Chart, lab work & pertinent test results  Airway Mallampati: II TM Distance: >3 FB Neck ROM: Full    Dental no notable dental hx.    Pulmonary neg pulmonary ROS,  breath sounds clear to auscultation  Pulmonary exam normal       Cardiovascular negative cardio ROS  Rhythm:Regular Rate:Normal     Neuro/Psych negative neurological ROS  negative psych ROS   GI/Hepatic negative GI ROS, Neg liver ROS,   Endo/Other  negative endocrine ROS  Renal/GU negative Renal ROS  negative genitourinary   Musculoskeletal negative musculoskeletal ROS (+)   Abdominal   Peds negative pediatric ROS (+)  Hematology  (+) Blood dyscrasia, anemia ,   Anesthesia Other Findings   Reproductive/Obstetrics negative OB ROS                           Anesthesia Physical Anesthesia Plan  ASA: II  Anesthesia Plan: General   Post-op Pain Management:    Induction: Intravenous  Airway Management Planned: LMA  Additional Equipment:   Intra-op Plan:   Post-operative Plan:   Informed Consent: I have reviewed the patients History and Physical, chart, labs and discussed the procedure including the risks, benefits and alternatives for the proposed anesthesia with the patient or authorized representative who has indicated his/her understanding and acceptance.   Dental advisory given  Plan Discussed with: CRNA and Surgeon  Anesthesia Plan Comments:         Anesthesia Quick Evaluation

## 2013-07-21 NOTE — ED Notes (Addendum)
Patient states that foley catheter stop draining. Patient and wife attempted to irrigate at home as they were instructed with not results. Patient complains of spasms and discomfort without urinary output. Patient is standing and pacing. Patient has bloody urine in bag.

## 2013-07-21 NOTE — Transfer of Care (Signed)
Immediate Anesthesia Transfer of Care Note  Patient: Lee Collins  Procedure(s) Performed: Procedure(s): TRANSURETHRAL RESECTION OF THE PROSTATE (TURP) with clot evacuation in bladder (N/A)  Patient Location: PACU  Anesthesia Type:General  Level of Consciousness: awake, oriented, patient cooperative, lethargic and responds to stimulation  Airway & Oxygen Therapy: Patient Spontanous Breathing and Patient connected to face mask oxygen  Post-op Assessment: Report given to PACU RN, Post -op Vital signs reviewed and stable and Patient moving all extremities  Post vital signs: Reviewed and stable  Complications: No apparent anesthesia complications

## 2013-07-21 NOTE — ED Notes (Signed)
Received pt from [facility] RN  Pt is from home he is here for blood clots from penis,  Dr Kathrynn Running urologist at bedside

## 2013-07-21 NOTE — Preoperative (Addendum)
Beta Blockers   Reason not to administer Beta Blockers:Not Applicable  Antibiotic: pt on scheduled antibiotic which was already given at 0600

## 2013-07-21 NOTE — ED Notes (Signed)
Bed: WA21 Expected date:  Expected time:  Means of arrival:  Comments: Bed 4

## 2013-07-22 LAB — BASIC METABOLIC PANEL
BUN: 6 mg/dL (ref 6–23)
Calcium: 8.6 mg/dL (ref 8.4–10.5)
Chloride: 105 mEq/L (ref 96–112)
Creatinine, Ser: 0.72 mg/dL (ref 0.50–1.35)
GFR calc non Af Amer: >90 mL/min (ref >90)
Glucose, Bld: 113 mg/dL — ABNORMAL HIGH (ref 70–99)
Sodium: 137 mEq/L (ref 135–145)

## 2013-07-22 LAB — HEMOGLOBIN AND HEMATOCRIT, BLOOD: HCT: 26.2 % — ABNORMAL LOW (ref 39.0–52.0)

## 2013-07-22 NOTE — Discharge Summary (Signed)
Physician Discharge Summary  Patient ID: Lee Collins MRN: [id number] DOB/AGE: Nov 21, 1945 67 y.o.  Admit date: 07/21/2013 Discharge date: 07/22/2013  Admission Diagnoses:Urinary retention.  Gross hematuria. BPH  Discharge Diagnoses: Same Active Problems:   * No active hospital problems. *   Discharged Condition: Improved.  Hospital Course:The patient is a 67 years old male with history of recurrent episodes of gross hematuria and clots urinary retention since end of August.  He was recently admitted on 10/1 for clots urinary retention. He was sent home with indwelling Foley catheter.  Went again in clots urinary retention on 10/3.  Was seen in the ER and admitted.  CT scan in August revealed no renal mass, enlarged prostate.  He had cystoscopy,clots evacuation and TURP on 10/4.  In the morning of 10/5 he had one episode of nausea that was relieved with Zofran.  Urine was grossly clear.  CBI was then discontinued.  Urine remains clear.  He no longer has nausea.  He does not have any pain.  He will be discharged home this afternoon to be followed as outpatient by Dr Berneice Heinrich    Consults: None    Treatments: Cystoscopy, blood clots evacuation, TURP 07/21/13.  Discharge Exam: Blood pressure 114/57, pulse 57, temperature 98.4 F (36.9 C), temperature source Oral, resp. rate 16, height 5\' 8"  (1.727 m), weight 66.679 kg (147 lb), SpO2 100.00%. Abdomen: Soft, non distended, non tender. Foley draining well.  Urine grossly clear. Hgb: 9.3.  Hct: 26.2  BUN: 6  Creatinine: 0.72  Disposition: 01-Home or Self Care   Future Appointments Provider Department Dept Phone   08/06/2013 9:15 AM Ardeth Sportsman, MD [facility], [place] [phone]       Medication List    STOP taking these medications       aspirin EC 81 MG tablet     tamsulosin 0.4 MG Caps capsule  Commonly known as:  FLOMAX      TAKE these medications       finasteride 5 MG tablet  Commonly known as:  PROSCAR  Take  5 mg by mouth daily.     trimethoprim 100 MG tablet  Commonly known as:  TRIMPEX  Take 100 mg by mouth daily.         Signed: Magalie Almon-HENRY 07/22/2013, 3:09 PM

## 2013-07-22 NOTE — Progress Notes (Signed)
Complains of nausea and "lightheadedness" after ambulating. Medicated for nausea, resting in bed.

## 2013-07-22 NOTE — Progress Notes (Signed)
1 Day Post-Op Subjective: Patient reports : Felt dizzy this morning. Had some nausea relieved by antiemetics. Mild suprapubic discomfort.   Objective: Vital signs in last 24 hours: Temp:  [97 F (36.1 C)-98.7 F (37.1 C)] 98.4 F (36.9 C) (10/05 0509) Pulse Rate:  [57-98] 57 (10/05 0509) Resp:  [12-18] 16 (10/05 0509) BP: (105-136)/(43-69) 114/57 mmHg (10/05 0509) SpO2:  [93 %-100 %] 100 % (10/05 0509)  Intake/Output from previous day: 10/04 0701 - 10/05 0700 In: 9975.8 [I.V.:3967.5; IV Piggyback:108.3] Out: 9175 [Urine:9175] Intake/Output this shift:    Physical Exam:  General: Alert and oriented. Tolerates diet well. No abdominal pain. CBI running.  Urine grossly clear Scrotum: normal. No testicular swelling.  Lab Results:  Recent Labs  07/21/13 0116 07/21/13 1250 07/22/13 0505  HGB 10.5* 8.9* 9.3*  HCT 31.0* 24.7* 26.2*   BMET  Recent Labs  07/21/13 0116 07/22/13 0505  NA 134* 137  K 3.8 4.2  CL 96 105  CO2  --  26  GLUCOSE 113* 113*  BUN 11 6  CREATININE 1.00 0.72  CALCIUM  --  8.6    Recent Labs  07/21/13 1250  INR 1.26   No results found for this basename: LABURIN,  in the last 72 hours Results for orders placed during the hospital encounter of 07/21/13  SURGICAL PCR SCREEN     Status: None   Collection Time    07/21/13  4:55 AM      Result Value Range Status   MRSA, PCR NEGATIVE  NEGATIVE Final   Staphylococcus aureus NEGATIVE  NEGATIVE Final   Comment:            The Xpert SA Assay (FDA     approved for NASAL specimens     in patients over 56 years of age),     is one component of     a comprehensive surveillance     program.  Test performance has     been validated by [employer] for patients greater     than or equal to 34 year old.     It is not intended     to diagnose infection nor to     guide or monitor treatment.    Studies/Results: No results found.  Assessment/Plan:  Gross hematuria.  Urinary retention.   BPH.  Discontinue CBI.  Hgb is stable.  No need for transfusion at this time  Discharge home this PM if urine remains clear and patient is stable.   LOS: 1 day   Lee Collins 07/22/2013, 10:18 AM

## 2013-07-22 NOTE — Op Note (Signed)
Lee Collins, Lee Collins NO.:  [id number]  MEDICAL RECORD NO.:  [id number]  LOCATION:  [facility]                         FACILITY:  [facility]  PHYSICIAN:  Sebastian Ache, MD     DATE OF BIRTH:  1946/07/27  DATE OF PROCEDURE: 07/21/2013 DATE OF DISCHARGE:                              OPERATIVE REPORT   DIAGNOSES: 1. Recurrent hematuria with clot retention. 2. Prostatic hypertrophy.  PROCEDURES: 1. Cystoscopy with clot evacuation and fulguration of bleeders. 2. Transurethral resection of the prostate.  ESTIMATED BLOOD LOSS:  200 mL new blood.  Approximately 300 mL of formed clot in the urinary bladder.  FINDINGS: 1. Massive bilobar prostatic hypertrophy. 2. Approximate 300 mL of formed clot in the urinary bladder. 3. No evidence of prostatic perforation, bladder perforation, or     damage to ureteral orifices following resection.  DRAINS:  22-French 3-way silicone catheter to normal saline irrigation, 2 drops per second efflux light pink.  SPECIMEN:  Prostate chips for permanent pathology.  INDICATION:  Mr. Gwaltney is a very pleasant 67 year old gentleman with a recent history of recurrent urinary retention as well as hematuria with clot retention for the past 6 weeks.  He has been managed medically with alpha blocker and 5 alpha reductase inhibitor, and his symptoms have been refractory.  Notably, he has history of a negative prostate biopsy previously.  Recently, he was admitted to the hospital last week with recurrent clot retention, was managed conservatively; however, he represented yesterday with recurrent clot retention.  Given the constellation of symptoms and long-term and short-term goals, the patient elected to proceed with transurethral resection of the prostate with clot evacuation and fulguration of bleeders.  Informed consent was obtained and placed in the medical record.  DESCRIPTION OF PROCEDURE:  The patient is being Mittie Bodo, procedure being  transurethral resection of prostate with clot evacuation was confirmed.  Procedure carried out.  Time-out was performed.  Intravenous antibiotics administered.  General LMA anesthesia was induced.  The patient was placed into a low lithotomy position.  Sterile field was created by prepping and draping the patient's penis, perineum, proximal thighs using iodine x3.  Next, cystourethroscopy was performed using 22- French rigid scope with 12-degree offset lens.  Inspection of the anterior urethra was unremarkable.  Inspection of the posterior urethra revealed a massive bilobar prostatic hypertrophy.  No evidence of false pass.  Inspection of urinary bladder revealed a large amount of formed clot in the urinary bladder.  Ureteral orifices in the normal anatomic position.  There was no evidence of median lobe.  The cystoscope was exchanged for a 26-French ACMI continuous flow resectoscope sheath using visual obturator, and clot evacuation was performed.  Approximately 300 mL of old clots that was well formed was evacuated and set aside for discard.  Repeat inspection of the urinary bladder revealed no diverticula, calcifications, papillary lesions.  There was evidence of active bleeding from a very friable prostatic urethra.  Attention was then directed to transurethral resection of prostate using a medium resectoscope loop.  Careful resection was performed in a top-down fashion, first forming a channel at the 12 o'clock position, followed by careful resection of the right lobe  from the bladder base to the area of verumontanum taking great care not to resect distal to the verumontanum. This was carried down to what appeared to be the circular fibers of the prostatic capsule first on the right side.  A mirror image resection was performed of the left prostatic lobe.  Following these maneuvers, the bladder chips were irrigated and set aside.  These weighed over 100 g in total.  The resectoscope  loop was exchanged for the button electrode, and fulguration was applied to the base of the entire prostatic fossa with some smoothing of any irregularities.  Following these maneuvers, hemostasis appeared excellent.  There was a wide open urinary channel from the bladder base towards the area of the verumontanum.  There was no evidence of resection distal to the verumontanum.  Ureteral orifices were inspected and uninjured.  There was no evidence of bladder perforation.  The resectoscope was then exchanged for a 22-French 3-way Foley catheter, silicone type over a catheter guide.  The 45 mL sterile water placed in the balloon.  This connected to normal saline irrigation with the efflux light pink at about 2 drops per second.  Procedure was then terminated.  The patient tolerated the procedure well.  There were no immediate periprocedural complications.  The patient was taken to postanesthesia care unit in stable condition.  Notably, the patient's starting hemoglobin was around 10 or less. Intraperitoneal hemoglobin was found to be 8.5.  He was typed and crossed, but no indication for transfusion at this point.          ______________________________ Sebastian Ache, MD     TM/MEDQ  D:  07/21/2013  T:  07/21/2013  Job:  [id number]

## 2013-07-23 ENCOUNTER — Encounter (HOSPITAL_COMMUNITY): Payer: Self-pay | Admitting: Urology

## 2013-07-23 LAB — POCT I-STAT 4, (NA,K, GLUC, HGB,HCT)
Hemoglobin: 8.2 g/dL — ABNORMAL LOW (ref 13.0–17.0)
Sodium: 138 mEq/L (ref 135–145)

## 2013-07-24 NOTE — Anesthesia Postprocedure Evaluation (Signed)
  Anesthesia Post-op Note  Patient: Lee Collins  Procedure(s) Performed: Procedure(s) (LRB): TRANSURETHRAL RESECTION OF THE PROSTATE (TURP) with clot evacuation in bladder (N/A)  Patient Location: PACU  Anesthesia Type: General  Level of Consciousness: awake and alert   Airway and Oxygen Therapy: Patient Spontanous Breathing  Post-op Pain: mild  Post-op Assessment: Post-op Vital signs reviewed, Patient's Cardiovascular Status Stable, Respiratory Function Stable, Patent Airway and No signs of Nausea or vomiting  Last Vitals:  Filed Vitals:   07/22/13 0509  BP: 114/57  Pulse: 57  Temp: 36.9 C  Resp: 16    Post-op Vital Signs: stable   Complications: No apparent anesthesia complications

## 2013-07-25 LAB — TYPE AND SCREEN
ABO/RH(D): O POS
Antibody Screen: NEGATIVE
Unit division: 0

## 2013-08-06 ENCOUNTER — Encounter (INDEPENDENT_AMBULATORY_CARE_PROVIDER_SITE_OTHER): Payer: Self-pay | Admitting: Surgery

## 2013-08-06 ENCOUNTER — Ambulatory Visit (INDEPENDENT_AMBULATORY_CARE_PROVIDER_SITE_OTHER): Payer: BC Managed Care – PPO | Admitting: Surgery

## 2013-08-06 VITALS — BP 100/60 | HR 62 | Temp 97.0°F | Resp 14 | Ht 68.5 in | Wt 141.4 lb

## 2013-08-06 DIAGNOSIS — K440 Diaphragmatic hernia with obstruction, without gangrene: Secondary | ICD-10-CM

## 2013-08-06 NOTE — Progress Notes (Signed)
Subjective:     Patient ID: Lee Collins, male   DOB: Nov 01, 1945, 67 y.o.   MRN: [id number]  HPI   Dal Blew  June 27, 1946 [id number]  Patient Care Team: Johny Blamer, MD as PCP - General (Family Medicine) Ardeth Sportsman, MD as Consulting Physician (General Surgery) Barrie Folk, MD as Consulting Physician (Gastroenterology) Sebastian Ache, MD as Consulting Physician (Urology)  Procedure (Date: 06/15/2013):  POST-OPERATIVE DIAGNOSIS: Incarcerated paraesophageal hiatal hernia   PROCEDURE:  1. Laparoscopic reduction of paraesophageal hiatal hernia  2. Primary repair of hiatal hernia over pledgets.  3. Type II mediastinal dissection.  4. Anterior & posterior gastropexy.  5. Nissen fundoplication 2cm over a 56-French bougie   SURGEON:  Ardeth Sportsman, MD  Lodema Pilot, DO - Assist   This patient returns for surgical re-evaluation.   Patient had difficulty with urination.  Difficulty getting a catheter back in.  Being followed by urology.  Ultimately had a TURP done by Dr. Berneice Heinrich  He is eating well.  He is held off on beans and carbonation.  Walking well.  Hoping to get back to more intense exercise after recovering from the TURP.  Some mild early satiety.  No bloating.  No fevers chills or sweats.  Overall in good spirits  Patient Active Problem List   Diagnosis Date Noted  . Incarcerated paraesophageal hernia s/p lap repair 06/15/2013    Priority: Medium  . Acute urinary retention 07/16/2013  . BPH (benign prostatic hyperplasia) 07/16/2013    Past Medical History  Diagnosis Date  . Hiatal hernia     Past Surgical History  Procedure Laterality Date  . Appendectomy    . Left elbow surgery    . Vocal cord surgery    . Right rotator cuff repair    . Tonsillectomy    . Laparoscopic nissen fundoplication N/A 06/15/2013    Procedure: LAPAROSCOPIC PARAESOPHAGEAL HERNIA  ;  Surgeon: Ardeth Sportsman, MD;  Location: [facility];  Service: General;  Laterality: N/A;  . Hernia  repair    . Transurethral resection of prostate N/A 07/21/2013    Procedure: TRANSURETHRAL RESECTION OF THE PROSTATE (TURP) with clot evacuation in bladder;  Surgeon: Sebastian Ache, MD;  Location: [facility];  Service: Urology;  Laterality: N/A;    History   Social History  . Marital Status: Married    Spouse Name: N/A    Number of Children: N/A  . Years of Education: N/A   Occupational History  . Not on file.   Social History Main Topics  . Smoking status: Never Smoker   . Smokeless tobacco: Never Used  . Alcohol Use: No  . Drug Use: No  . Sexual Activity: No   Other Topics Concern  . Not on file   Social History Narrative  . No narrative on file    History reviewed. No pertinent family history.  Current Outpatient Prescriptions  Medication Sig Dispense Refill  . finasteride (PROSCAR) 5 MG tablet Take 5 mg by mouth daily.       . hyoscyamine (LEVSIN SL) 0.125 MG SL tablet       . tamsulosin (FLOMAX) 0.4 MG CAPS capsule       . trimethoprim (TRIMPEX) 100 MG tablet Take 100 mg by mouth daily.        No current facility-administered medications for this visit.     Allergies  Allergen Reactions  . Latex Rash  . Betadine [Povidone Iodine] Rash  . Penicillins Rash    BP  100/60  Pulse 62  Temp(Src) 97 F (36.1 C) (Temporal)  Resp 14  Ht 5' 8.5" (1.74 m)  Wt 141 lb 6.4 oz (64.139 kg)  BMI 21.18 kg/m2  Dg Chest 1 View  06/15/2013   *RADIOLOGY REPORT*  Clinical Data: Emesis, hiatal hernia  CHEST - 1 VIEW  Comparison: 06/15/2013  Findings: Increased gas and decreased fluid within the large hernia though the degree of distension is similar.  The NG tube  descends straight into the subdiaphragmatic level/left upper quadrant, raising possibility that the hernia is paresophageal.  IMPRESSION: NG tube tip over the left upper quadrant.  Findings as above.   Original Report Authenticated By: Jearld Lesch, M.D.   Dg Chest 2 View  06/23/2013   *RADIOLOGY REPORT*  Clinical  Data: Chest pain and shortness of breath.  CHEST - 2 VIEW  Comparison: Chest x-ray 06/15/2013.  Findings: Lung volumes are normal.  No consolidative airspace disease.  No pleural effusions.  No pneumothorax.  No pulmonary nodule or mass noted.  Pulmonary vasculature and the cardiomediastinal silhouette are within normal limits. Atherosclerosis in the thoracic aorta.  Previously noted massive hiatal hernia is no longer identified.  IMPRESSION: 1. No radiographic evidence of acute cardiopulmonary disease. 2.  Atherosclerosis.   Original Report Authenticated By: Trudie Reed, M.D.   Ct Angio Chest Pe W/cm &/or Wo Cm  06/23/2013   *RADIOLOGY REPORT*  Clinical Data: Chest pain, shortness of breath, hard debris, pain in chest and back when taking deep breath  CT ANGIOGRAPHY CHEST  Technique:  Multidetector CT imaging of the chest using the standard protocol during bolus administration of intravenous contrast. Multiplanar reconstructed images including MIPs were obtained and reviewed to evaluate the vascular anatomy.  Contrast: OMNIPAQUE IOHEXOL 350 MG/ML SOLN pain  Comparison: None.  Findings: Atherosclerotic calcifications of the coronary arteries and less in the thoracic aorta. No aortic aneurysm or dissection. Mild gaseous distention of the distal thoracic esophagus with question mild wall thickening. Large soft tissue defect is identified at the gastroesophageal junction likely representing Nissen fundoplication though this makes it difficult to exclude mass. No thoracic adenopathy.  Remaining visualized portions of upper abdomen unremarkable. Pulmonary arteries patent. No evidence of pulmonary embolism. Dependent atelectasis and right lower lobe. Remaining lungs clear. No infiltrate, pleural effusion or pneumothorax. No acute osseous findings.  IMPRESSION: No evidence of pulmonary embolism. Probable Nissen fundoplication accounting for density at the gastroesophageal junction. Question mild diffuse wall  thickening of the distal thoracic esophagus, nonspecific, can be seen with reflux disease and infection.   Original Report Authenticated By: Ulyses Southward, M.D.   Ct Abdomen Pelvis W Contrast  06/15/2013   *RADIOLOGY REPORT*  Clinical Data: Emesis, hiatal hernia.  CT ABDOMEN AND PELVIS WITH CONTRAST  Technique:  Multidetector CT imaging of the abdomen and pelvis was performed following the standard protocol during bolus administration of intravenous contrast.  Contrast: OMNIPAQUE IOHEXOL 300 MG/ML  SOLN  Comparison: 06/15/2013 radiographs  Findings: Mild left lung base opacity, favor atelectasis.  Normal heart size.  Coronary artery calcifications.  Intrathoracic stomach with organoaxial rotation.  Narrowing of the gastric outlet due to the hernia with no contrast reaching the duodenum.  NG tube descends into the stomach. No pneumatosis.  A couple subcentimeter hepatic hypodensities are nonspecific however favored to reflect biliary cyst or hamartoma.  Hypodensity adjacent to the falciform, favored to reflect focal fat or variant perfusion.  Partially contracted gallbladder with mild wall thickening, nonspecific.  No biliary ductal dilatation.  No radiodense gallstones.  Unremarkable spleen, pancreas, adrenal glands.  Subcentimeter hypodensity right kidney.  Otherwise, symmetric renal enhancement.  No hydroureteronephrosis.  No CT evidence for colitis.  Appendix not identified.  No right lower quadrant inflammation.  Small bowel loops are decompressed and normal in course.  No free intraperitoneal air or fluid.  No lymphadenopathy.  Thin-walled bladder.  Marked prostatomegaly with heterogeneous/nodular enhancement.  Tiny fat containing left umbilical hernia.  Normal caliber aorta and branch vessels with mild scattered atherosclerotic disease.  No acute osseous finding.  Mild multilevel degenerative change.  IMPRESSION: Findings are most in keeping with obstructing organoaxial gastric volvulus. Recommend  surgical consultation.  Marked prostatomegaly with heterogeneous/nodular enhancement. Recommend non emergent workup to include PSA and digital rectal exam.  Discussed via telephone with Dr. Patria Mane at 03:50 a.m. on 06/15/2013.   Original Report Authenticated By: Jearld Lesch, M.D.   Dg Abd Acute W/chest  06/15/2013   *RADIOLOGY REPORT*  Clinical Data: Upper abdominal pain  ACUTE ABDOMEN SERIES (ABDOMEN 2 VIEW & CHEST 1 VIEW)  Comparison: None.  Findings: Lungs predominately clear.  Large hiatal hernia. Cardiomediastinal contours otherwise within normal range.  No free intraperitoneal air.  Large stool burden.  Relative paucity of small bowel gas.  Mild curvature of the spine.  Osteopenia.  IMPRESSION: Large hiatal hernia and a paucity of small bowel gas, can be seen with fluid filled or decompressed loops. Recommend NG tube decompression and cross-sectional imaging if clinically concerned for acute gastric obstruction or volvulus.  Large stool burden.   Original Report Authenticated By: Jearld Lesch, M.D.   Dg Esophagus W/water Sol Cm  06/16/2013   *RADIOLOGY REPORT*  Clinical Data:1 day postop para esophageal hernia repair.  Rule out leak or obstruction  ESOPHAGUS/BARIUM SWALLOW/TABLET STUDY  Fluoroscopy Time: 1-minute-6-second  Comparison:   CT 06/15/2013  Findings: Postop hiatal hernia repair with Nissen fundoplication. There is a surgical drain within the mediastinum around the distal esophagus.  Satisfactory hiatal hernia reduction and repair.  There is edema at the gastroesophageal junction.  There is a thin patent lumen which partially drains the esophagus.  With the patient standing, contrast remained in the esophagus and was slow to drain into the stomach suggesting edema at the repair site.  No leak.  The stomach is decompressed and is within the abdominal cavity.  IMPRESSION: Satisfactory hiatal hernia repair.  There is no leak.  There is edema at the GE junction with narrowing of the lumen.   Esophagus was slow to empty into the stomach.   Original Report Authenticated By: Janeece Riggers, M.D.     Review of Systems  Constitutional: Negative for fever, chills and diaphoresis.  HENT: Negative for sore throat and trouble swallowing.   Eyes: Negative for photophobia and visual disturbance.  Respiratory: Negative for choking and shortness of breath.   Cardiovascular: Negative for chest pain and palpitations.  Gastrointestinal: Negative for nausea, vomiting, abdominal distention, anal bleeding and rectal pain.  Genitourinary: Negative for dysuria, urgency, difficulty urinating and testicular pain.  Musculoskeletal: Negative for arthralgias, gait problem, myalgias and neck pain.  Skin: Negative for color change and rash.  Neurological: Negative for dizziness, speech difficulty, weakness and numbness.  Hematological: Negative for adenopathy.  Psychiatric/Behavioral: Negative for hallucinations, confusion and agitation.       Objective:   Physical Exam  Constitutional: He is oriented to person, place, and time. He appears well-developed and well-nourished. No distress.  HENT:  Head: Normocephalic.  Mouth/Throat: Oropharynx is clear and  moist. No oropharyngeal exudate.  Eyes: Conjunctivae and EOM are normal. Pupils are equal, round, and reactive to light. No scleral icterus.  Neck: Normal range of motion. No tracheal deviation present.  Cardiovascular: Normal rate, normal heart sounds and intact distal pulses.   Pulmonary/Chest: Effort normal. No respiratory distress.  Abdominal: Soft. He exhibits no distension. There is no tenderness. Hernia confirmed negative in the right inguinal area and confirmed negative in the left inguinal area.  Incisions clean with normal healing ridges.  No hernias  Musculoskeletal: Normal range of motion. He exhibits no tenderness.  Neurological: He is alert and oriented to person, place, and time. No cranial nerve deficit. He exhibits normal muscle tone.  Coordination normal.  Skin: Skin is warm and dry. No rash noted. He is not diaphoretic.  Psychiatric: He has a normal mood and affect. His behavior is normal.        Assessment:     Recovering rather well 6 weeks out status post emergent laparoscopic incarcerated paraesophageal hiatal repair for organoaxial volvulus.     Plan:     Increase activity as tolerated to regular activity.  Low impact exercise such as walking an hour a day at least ideal.  It is okay to resume his workout routine as long as he gradually increases into it over the next two weeks.   Do not push through pain.  Followup with Dr Berneice Heinrich with urology concerning his BPH.  Diet as tolerated.  Following esophageal surgery sheet to gradually transition to low fat high fiber diet.  Bowel regimen with 30 g fiber a day and fiber supplement as needed to avoid problems.  Okay to do limited doses of carbonation.  I have no restriction on beans.  Return to clinic as needed.   Instructions discussed.  Followup with primary care physician for other health issues as would normally be done.  Questions answered.  The patient expressed understanding and appreciation

## 2013-08-06 NOTE — Patient Instructions (Signed)
EATING AFTER YOUR ESOPHAGEAL SURGERY (Stomach Fundoplication, Hiatal Hernia repair, Achalasia surgery, etc)  After your esophageal surgery, expect some sticking with swallowing over the next 1-2 months.    If food sticks when you eat, it is called "dysphagia".  This is due to swelling around your esophagus at the wrap & hiatal diaphragm repair.  It will gradually ease off over the next few months.  To help you through this temporary phase, we start you out on a pureed (blenderized) diet.  Your first meal in the hospital was thin liquids.  You should have been given a pureed diet by the time you left the hospital.  We ask patients to stay on a pureed diet for the first 2-3 weeks to avoid anything getting "stuck" near your recent surgery.  Don't be alarmed if your ability to swallow doesn't progress according to this plan.  Everyone is different and some diets can advance more or less quickly.     Some BASIC RULES to follow are:  Maintain an upright position whenever eating or drinking.  Take small bites - just a teaspoon size bite at a time.  Eat slowly.  It may also help to eat only one food at a time.  Consider nibbling through smaller, more frequent meals & avoid the urge to eat BIG meals  Do not push through feelings of fullness, nausea, or bloatedness  Do not mix solid foods and liquids in the same mouthful  Try not to "wash foods down" with large gulps of liquids. Avoid carbonated (bubbly/fizzy) drinks.  Understand that it will be hard to burp and belch at first.  This gradually improves with time.  Expect to be more gassy/flatulent/bloated initially.  Walking will help you work through that.  Maalox/Gas-X can help as well.  Eat in a relaxed atmosphere & minimize distractions.  Avoid talking while eating.    Do not use straws.  Following each meal, sit in an upright position (90 degree angle) for 60 to 90 minutes.  Going for a short walk can help as well  If food does stick,  don't panic.  Try to relax and let the food pass on its own.  Sipping WARM LIQUID such as strong hot black tea can also help slide it down.   Be gradual in changes & use common sense:  -If you easily tolerating a certain "level" of foods, advance to the next level gradually -If you are having trouble swallowing a particular food, then avoid it.   -If food is sticking when you advance your diet, go back to thinner previous diet (the lower LEVEL) for 1-2 days.  LEVEL 1 = PUREED DIET  Do for the first 2 WEEKS AFTER SURGERY  -Foods in this group are pureed or blenderized to a smooth, mashed potato-like consistency.  -If necessary, the pureed foods can keep their shape with the addition of a thickening agent.   -Meat should be pureed to a smooth, pasty consistency.  Hot broth or gravy may be added to the pureed meat, approximately 1 oz. of liquid per 3 oz. serving of meat. -CAUTION:  If any foods do not puree into a smooth consistency, swallowing will be more difficult.  (For example, nuts or seeds sometimes do not blend well.)  Hot Foods Cold Foods  Pureed scrambled eggs and cheese Pureed cottage cheese  Baby cereals Thickened juices and nectars  Thinned cooked cereals (no lumps) Thickened milk or eggnog  Pureed [place] toast or pancakes Ensure  Mashed  potatoes Ice cream  Pureed parsley, au gratin, scalloped potatoes, candied sweet potatoes Fruit or [place] ice, sherbet  Pureed buttered or alfredo noodles Plain yogurt  Pureed vegetables (no corn or peas) Instant breakfast  Pureed soups and creamed soups Smooth pudding, mousse, custard  Pureed scalloped apples Whipped gelatin  Gravies Sugar, syrup, honey, jelly  Sauces, cheese, tomato, barbecue, white, creamed Cream  Any baby food Creamer  Alcohol in moderation (not beer or champagne) Margarine  Coffee or tea Mayonnaise   Ketchup, mustard   Apple sauce   SAMPLE MENU:  PUREED DIET Breakfast Lunch Dinner   Orange juice, 1/2  cup  Cream of wheat, 1/2 cup  Pineapple juice, 1/2 cup  Pureed [place], barley soup, 3/4 cup  Pureed Hawaiian chicken, 3 oz   Scrambled eggs, mashed or blended with cheese, 1/2 cup  Tea or coffee, 1 cup   Whole milk, 1 cup   Non-dairy creamer, 2 Tbsp.  Mashed potatoes, 1/2 cup  Pureed cooled broccoli, 1/2 cup  Apple sauce, 1/2 cup  Coffee or tea  Mashed potatoes, 1/2 cup  Pureed spinach, 1/2 cup  Frozen yogurt, 1/2 cup  Tea or coffee      LEVEL 2 = SOFT DIET  After your first 2 weeks, you can advance to a soft diet.   Keep on this diet until everything goes down easily.  Hot Foods Cold Foods  White fish Cottage cheese  Stuffed fish Junior baby fruit  Baby food meals Semi thickened juices  Minced soft cooked, scrambled, poached eggs nectars  Souffle & omelets Ripe mashed bananas  Cooked cereals Canned fruit, pineapple sauce, milk  potatoes Milkshake  Buttered or Alfredo noodles Custard  Cooked cooled vegetable Puddings, including tapioca  Sherbet Yogurt  Vegetable soup or alphabet soup Fruit ice, [place] ice  Gravies Whipped gelatin  Sugar, syrup, honey, jelly Junior baby desserts  Sauces:  Cheese, creamed, barbecue, tomato, white Cream  Coffee or tea Margarine   SAMPLE MENU:  LEVEL 2 Breakfast Lunch Dinner   Orange juice, 1/2 cup  Oatmeal, 1/2 cup  Scrambled eggs with cheese, 1/2 cup  Decaffeinated tea, 1 cup  Whole milk, 1 cup  Non-dairy creamer, 2 Tbsp  Pineapple juice, 1/2 cup  Minced beef, 3 oz  Gravy, 2 Tbsp  Mashed potatoes, 1/2 cup  Minced fresh broccoli, 1/2 cup  Applesauce, 1/2 cup  Coffee, 1 cup  [place], barley soup, 3/4 cup  Minced Hawaiian chicken, 3 oz  Mashed potatoes, 1/2 cup  Cooked spinach, 1/2 cup  Frozen yogurt, 1/2 cup  Non-dairy creamer, 2 Tbsp      LEVEL 3 = CHOPPED DIET  -After all the foods in level 2 (soft diet) are passing through well you should advance up to more chopped foods.  -It is still  important to cut these foods into small pieces and eat slowly.  Hot Foods Cold Foods  Poultry Cottage cheese  Chopped Swedish meatballs Yogurt  Meat salads (ground or flaked meat) Milk  Flaked fish (tuna) Milkshakes  Poached or scrambled eggs Soft, cold, dry cereal  Souffles and omelets Fruit juices or nectars  Cooked cereals Chopped canned fruit  Chopped [place] toast or pancakes Canned fruit cocktail  Noodles or pasta (no rice) Pudding, mousse, custard  Cooked vegetables (no frozen peas, corn, or mixed vegetables) Green salad  Canned small sweet peas Ice cream  Creamed soup or vegetable soup Fruit ice, [place] ice  Pureed vegetable soup or alphabet soup Non-dairy creamer  Ground scalloped  apples Margarine  Gravies Mayonnaise  Sauces:  Cheese, creamed, barbecue, tomato, white Ketchup  Coffee or tea Mustard   SAMPLE MENU:  LEVEL 3 Breakfast Lunch Dinner   Orange juice, 1/2 cup  Oatmeal, 1/2 cup  Scrambled eggs with cheese, 1/2 cup  Decaffeinated tea, 1 cup  Whole milk, 1 cup  Non-dairy creamer, 2 Tbsp  Ketchup, 1 Tbsp  Margarine, 1 tsp  Salt, 1/4 tsp  Sugar, 2 tsp  Pineapple juice, 1/2 cup  Ground beef, 3 oz  Gravy, 2 Tbsp  Mashed potatoes, 1/2 cup  Cooked spinach, 1/2 cup  Applesauce, 1/2 cup  Decaffeinated coffee  Whole milk  Non-dairy creamer, 2 Tbsp  Margarine, 1 tsp  Salt, 1/4 tsp  Pureed [place], barley soup, 3/4 cup  Barbecue chicken, 3 oz  Mashed potatoes, 1/2 cup  Ground fresh broccoli, 1/2 cup  Frozen yogurt, 1/2 cup  Decaffeinated tea, 1 cup  Non-dairy creamer, 2 Tbsp  Margarine, 1 tsp  Salt, 1/4 tsp  Sugar, 1 tsp    LEVEL 4:  REGULAR FOODS  -Foods in this group are soft, moist, regularly textured foods.   -This level includes meat and breads, which tend to be the hardest things to swallow.   -Eat very slowly, chew well and continue to avoid carbonated drinks. -most people are at this level in 4-6 weeks  Hot Foods  Cold Foods  Baked fish or skinned Soft cheeses - cottage cheese  Souffles and omelets Cream cheese  Eggs Yogurt  Stuffed shells Milk  Spaghetti with meat sauce Milkshakes  Cooked cereal Cold dry cereals (no nuts, dried fruit, coconut)  [place] toast or pancakes Crackers  Buttered toast Fruit juices or nectars  Noodles or pasta (no rice) Canned fruit  Potatoes (all types) Ripe bananas  Soft, cooked vegetables (no corn, lima, or baked beans) Peeled, ripe, fresh fruit  Creamed soups or vegetable soup Cakes (no nuts, dried fruit, coconut)  Canned chicken noodle soup Plain doughnuts  Gravies Ice cream  Bacon dressing Pudding, mousse, custard  Sauces:  Cheese, creamed, barbecue, tomato, white Fruit ice, [place] ice, sherbet  Decaffeinated tea or coffee Whipped gelatin  Pork chops Regular gelatin   Canned fruited gelatin molds   Sugar, syrup, honey, jam, jelly   Cream   Non-dairy   Margarine   Oil   Mayonnaise   Ketchup   Mustard    If you have any questions please call our office at [facility]: [phone].  Nissen Fundoplication Care After Please read the instructions outlined below and refer to this sheet for the next few weeks. These discharge instructions provide you with general information on caring for yourself after you leave the hospital. Your doctor may also give you specific instructions. While your treatment has been planned according to the most current medical practices available, unavoidable complications sometimes happen. If you have any problems or questions after discharge, please call your doctor. ACTIVITY  Take frequent rest periods throughout the day.  Take frequent walks throughout the day. This will help to prevent blood clots.  Continue to do your coughing and deep breathing exercises once you get home. This will help to prevent pneumonia.  No strenuous activities such as heavy lifting, pushing or pulling until after your follow-up visit with  your doctor. Do not lift anything heavier than 10 pounds.  Talk with your caregiver about when you may return to work and your exercise routine.  You may shower 2 days after surgery. Pat incisions dry. Do  not rub incisions with washcloth or towel.  Do not drive while taking prescription pain medication. NUTRITION  Continue with a liquid diet, or the diet you were directed to take, until your first follow-up visit with your surgeon.  Drink fluids (6-8 glasses a day).  Call your caregiver for persistent nausea (feeling sick to your stomach), vomiting, bloating or difficulty swallowing. ELIMINATION It is very important not to strain during bowel movements. If constipation should occur, you may:  Take a mild laxative (such as Milk of Magnesia).  Add fruit and bran to your diet.  Drink more fluids.  Call your caregiver if constipation is not relieved. FEVER If you feel feverish or have shaking chills, take your temperature. If it is 102 F (38.9 C) or above, call your caregiver. The fever may mean there is an infection. PAIN CONTROL  If a prescription was given for a pain reliever, please follow your caregiver's directions.  Only take over-the-counter or prescription medicines for pain, discomfort, or fever as directed by your caregiver.  If the pain is not relieved by your medicine, becomes worse, or you have difficulty breathing, call your doctor. INCISION  It is normal for your cuts (incisions) from surgery to have a small amount of drainage for the first 1-2 days. Once the drainage has stopped, leave your incision(s) open to air.  Check your incision(s) and surrounding area daily for any redness, swelling, increased drainage or bleeding. If any of these are present or if the wound edges start to separate, call your doctor.  If you have small adhesive strips in place, they will peel and fall off. (If these strips are covered with a clear bandage, your doctor will tell you when  to remove them.)  If you have staples, your caregiver will remove them at the follow-up appointment. Document Released: 05/27/2004 Document Revised: 12/27/2011 Document Reviewed: 08/31/2007 [facility] Patient Information 2014 [place], [place].  GETTING TO GOOD BOWEL HEALTH. Irregular bowel habits such as constipation and diarrhea can lead to many problems over time.  Having one soft bowel movement a day is the most important way to prevent further problems.  The anorectal canal is designed to handle stretching and feces to safely manage our ability to get rid of solid waste (feces, poop, stool) out of our body.  BUT, hard constipated stools can act like ripping concrete bricks and diarrhea can be a burning fire to this very sensitive area of our body, causing inflamed hemorrhoids, anal fissures, increasing risk is perirectal abscesses, abdominal pain/bloating, an making irritable bowel worse.     The goal: ONE SOFT BOWEL MOVEMENT A DAY!  To have soft, regular bowel movements:    Drink at least 8 tall glasses of water a day.     Take plenty of fiber.  Fiber is the undigested part of plant food that passes into the colon, acting s "natures broom" to encourage bowel motility and movement.  Fiber can absorb and hold large amounts of water. This results in a larger, bulkier stool, which is soft and easier to pass. Work gradually over several weeks up to 6 servings a day of fiber (25g a day even more if needed) in the form of: o Vegetables -- Root (potatoes, carrots, turnips), leafy green (lettuce, salad greens, celery, spinach), or cooked high residue (cabbage, broccoli, etc) o Fruit -- Fresh (unpeeled skin & pulp), Dried (prunes, apricots, cherries, etc ),  or stewed ( applesauce)  o Whole grain breads, pasta, etc (whole wheat)  o  Bran cereals    Bulking Agents -- This type of water-retaining fiber generally is easily obtained each day by one of the following:  o Psyllium bran -- The psyllium plant is  remarkable because its ground seeds can retain so much water. This product is available as Metamucil, Konsyl, Effersyllium, Per Diem Fiber, or the less expensive generic preparation in drug and health food stores. Although labeled a laxative, it really is not a laxative.  o Methylcellulose -- This is another fiber derived from wood which also retains water. It is available as Citrucel. o Polyethylene Glycol - and "artificial" fiber commonly called Miralax or Glycolax.  It is helpful for people with gassy or bloated feelings with regular fiber o Flax Seed - a less gassy fiber than psyllium   No reading or other relaxing activity while on the toilet. If bowel movements take longer than 5 minutes, you are too constipated   AVOID CONSTIPATION.  High fiber and water intake usually takes care of this.  Sometimes a laxative is needed to stimulate more frequent bowel movements, but    Laxatives are not a good long-term solution as it can wear the colon out. o Osmotics (Milk of Magnesia, Fleets phosphosoda, Magnesium citrate, MiraLax, GoLytely) are safer than  o Stimulants (Senokot, Castor Oil, Dulcolax, Ex Lax)    o Do not take laxatives for more than 7days in a row.    IF SEVERELY CONSTIPATED, try a Bowel Retraining Program: o Do not use laxatives.  o Eat a diet high in roughage, such as bran cereals and leafy vegetables.  o Drink six (6) ounces of prune or apricot juice each morning.  o Eat two (2) large servings of stewed fruit each day.  o Take one (1) heaping tablespoon of a psyllium-based bulking agent twice a day. Use sugar-free sweetener when possible to avoid excessive calories.  o Eat a normal breakfast.  o Set aside 15 minutes after breakfast to sit on the toilet, but do not strain to have a bowel movement.  o If you do not have a bowel movement by the third day, use an enema and repeat the above steps.    Controlling diarrhea o Switch to liquids and simpler foods for a few days to avoid  stressing your intestines further. o Avoid dairy products (especially milk & ice cream) for a short time.  The intestines often can lose the ability to digest lactose when stressed. o Avoid foods that cause gassiness or bloating.  Typical foods include beans and other legumes, cabbage, broccoli, and dairy foods.  Every person has some sensitivity to other foods, so listen to our body and avoid those foods that trigger problems for you. o Adding fiber (Citrucel, Metamucil, psyllium, Miralax) gradually can help thicken stools by absorbing excess fluid and retrain the intestines to act more normally.  Slowly increase the dose over a few weeks.  Too much fiber too soon can backfire and cause cramping & bloating. o Probiotics (such as active yogurt, Align, etc) may help repopulate the intestines and colon with normal bacteria and calm down a sensitive digestive tract.  Most studies show it to be of mild help, though, and such products can be costly. o Medicines:   Bismuth subsalicylate (ex. Kayopectate, Pepto Bismol) every 30 minutes for up to 6 doses can help control diarrhea.  Avoid if pregnant.   Loperamide (Immodium) can slow down diarrhea.  Start with two tablets (4mg  total) first and then try one tablet every 6  hours.  Avoid if you are having fevers or severe pain.  If you are not better or start feeling worse, stop all medicines and call your doctor for advice o Call your doctor if you are getting worse or not better.  Sometimes further testing (cultures, endoscopy, X-ray studies, bloodwork, etc) may be needed to help diagnose and treat the cause of the diarrhea. o

## 2013-09-25 ENCOUNTER — Telehealth (INDEPENDENT_AMBULATORY_CARE_PROVIDER_SITE_OTHER): Payer: Self-pay

## 2013-09-25 NOTE — Telephone Encounter (Signed)
Pt calling in to report that he has noticed in the past month that his stools have been soft with having 1-3 x a day a soft BM. The pt does not have any diarrhea or water stools. The pt does not have any trouble with constipation. The pt was told by Dr Michaell Cowing to do the fiber regimen daily for the rest of his life but once his bowels become soft he stopped the fiber. The pt feels he doesn't need the fiber b/c he eats vegetables and nuts daily. The pt is never constipated. The pt just wants to make sure that you think the soft BM is ok b/c he is just not used to the soft BM. Please advise.

## 2013-10-01 NOTE — Telephone Encounter (Signed)
Good data for 30g of fiber a day.  Fiber supplement can help reach this goal.  If he can get it from high fiber diet instead, that is fine.  Try to avoid severe constipation.  BM q2d to TID is acceptable range

## 2013-10-01 NOTE — Telephone Encounter (Signed)
Called pt back to notify him that Dr Michaell Cowing said that having a BM 2-3 times a day is a acceptable range. I advised pt that if he can have a soft BM daily only being on a high fiber diet than that is ok but definitely do not want the pt to get constipated per Dr Michaell Cowing. The pt understands.

## 2014-05-08 IMAGING — CT CT ANGIO CHEST
2 of 7 series · 18 of 36 positions shown · IV contrast (CONTRAST)
Comparison: None.

CLINICAL DATA: Chest pain, shortness of breath, hard debris, pain
in chest and back when taking deep breath

CT ANGIOGRAPHY CHEST
TECHNIQUE: Multidetector CT imaging of the chest using the
standard protocol during bolus administration of intravenous
contrast. Multiplanar reconstructed images including MIPs were
obtained and reviewed to evaluate the vascular anatomy.
Contrast: 100mL OMNIPAQUE IOHEXOL 350 MG/ML SOLN pain

[Series 5: pe thins · axial · 0.74mm/px · z∈[+463,+772]mm · 17 of 349 slices shown]
[im 20/349  lung]
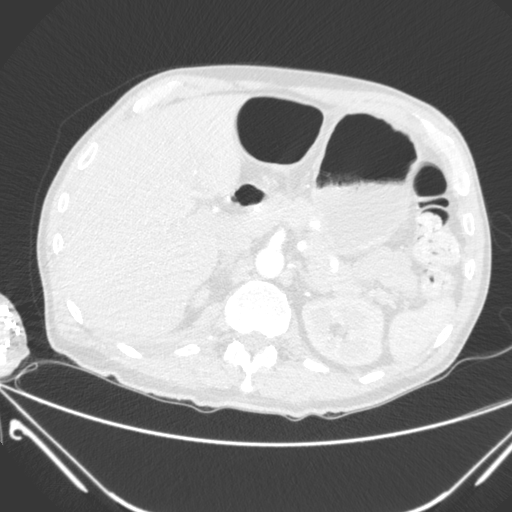
[im 39/349  mediastinal]
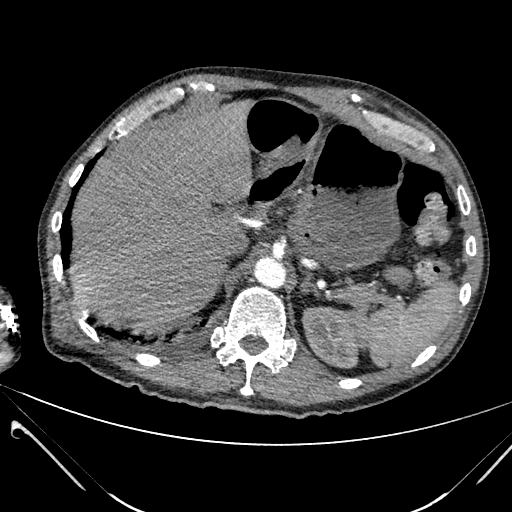
[im 59/349  lung]
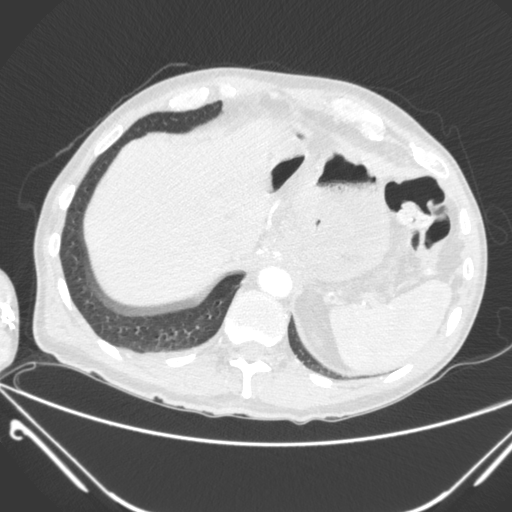
[im 78/349  mediastinal]
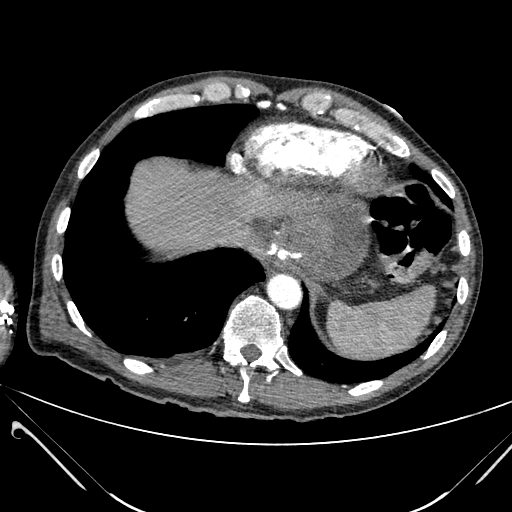
[im 97/349  lung]
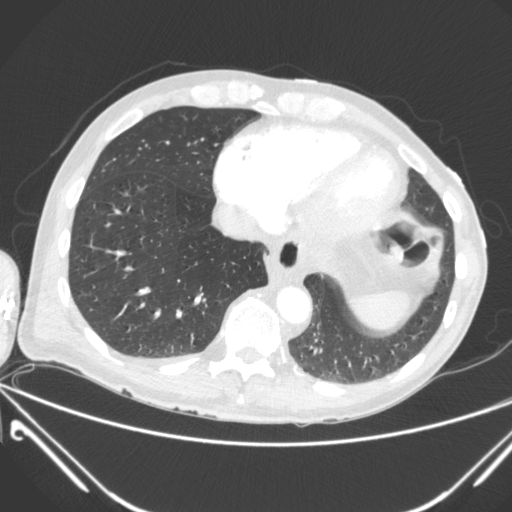
[im 117/349  mediastinal]
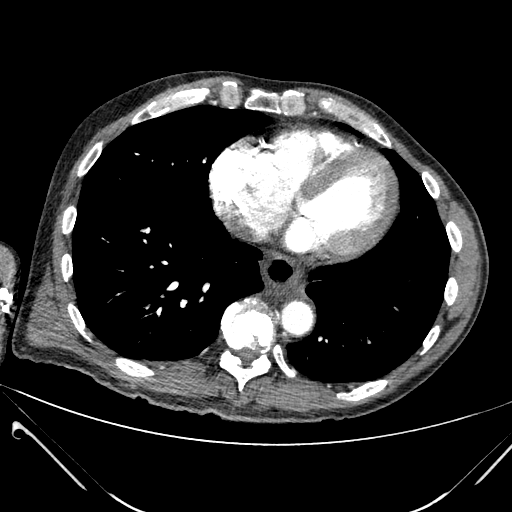
[im 136/349  lung]
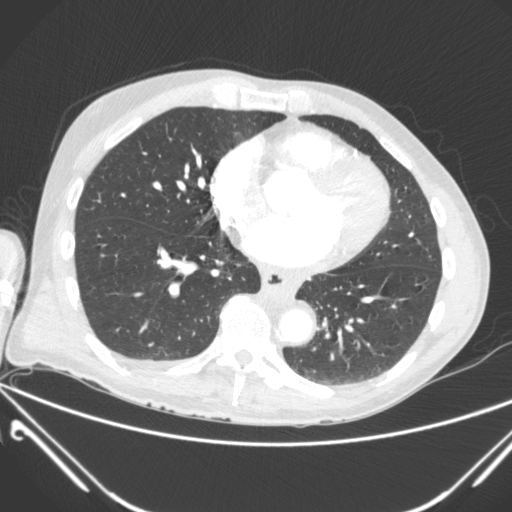
[im 155/349  mediastinal]
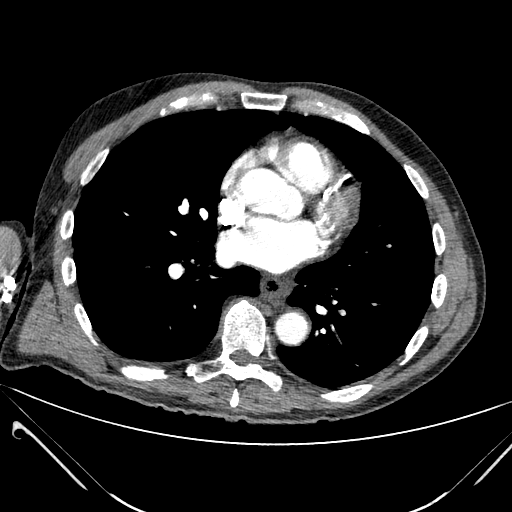
[im 175/349  lung]
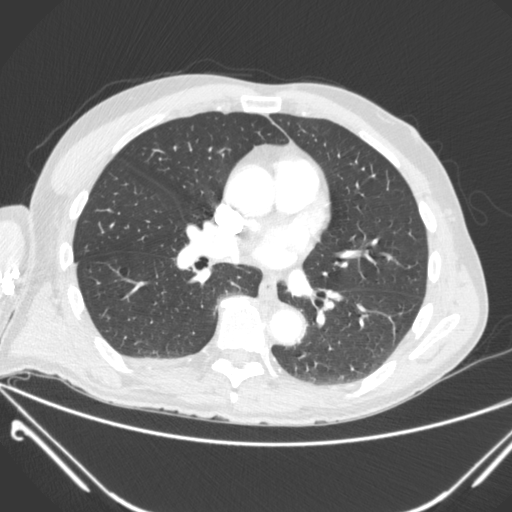
[im 194/349  mediastinal]
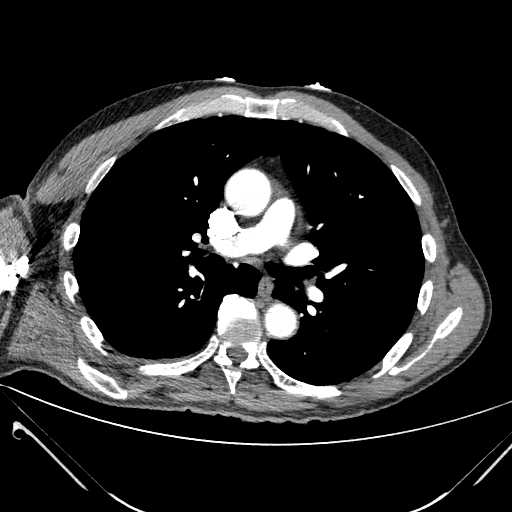
[im 213/349  lung]
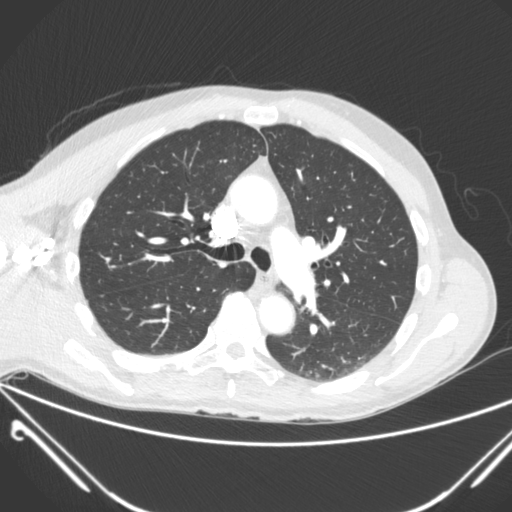
[im 233/349  mediastinal]
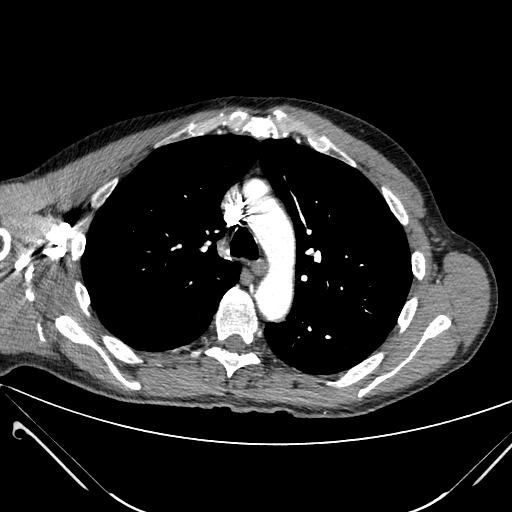
[im 252/349  lung]
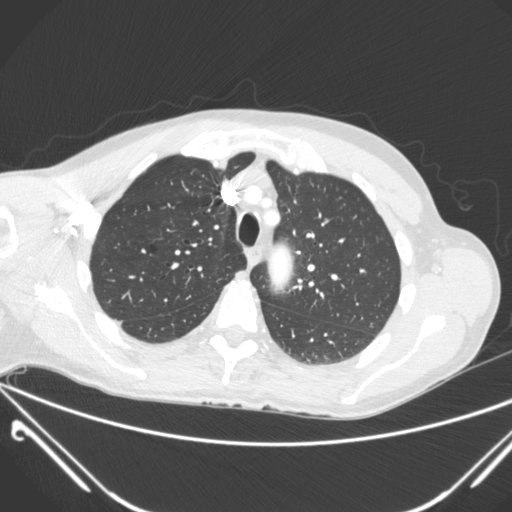
[im 271/349  mediastinal]
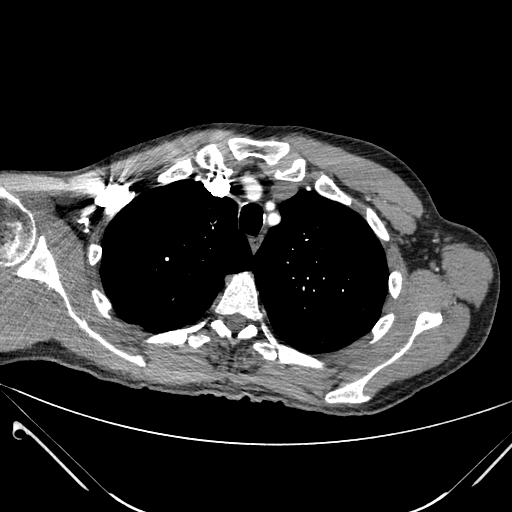
[im 291/349  lung]
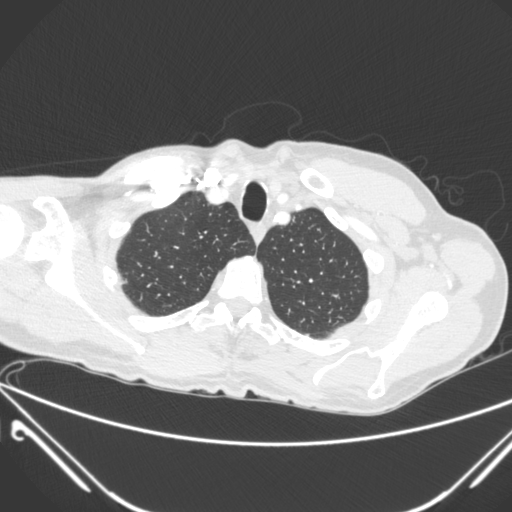
[im 310/349  mediastinal]
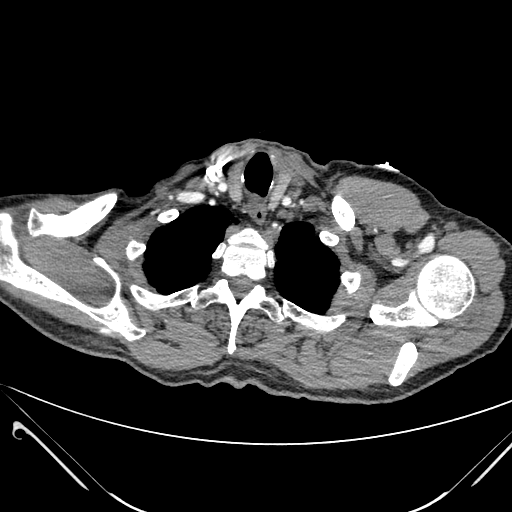
[im 329/349  lung]
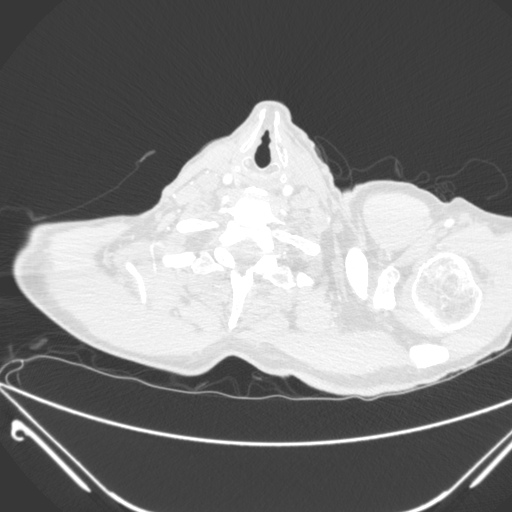

[mpr, coronals, coronal · coronal · 0.74mm/px · 1 of 124 slices shown]
[im 62/124  mediastinal]
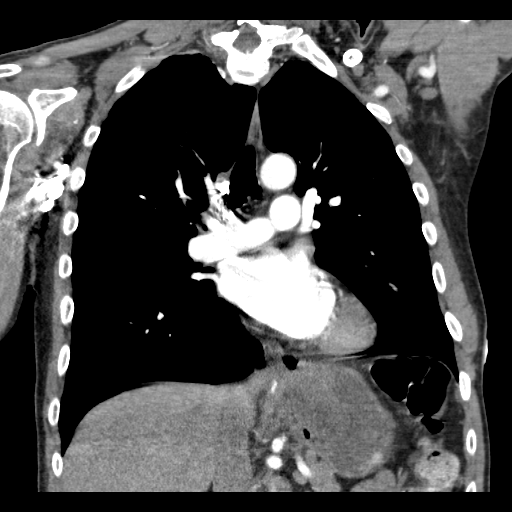

[18 of 36 positions shown; findings below may reference images not displayed]

FINDINGS: Atherosclerotic calcifications of the coronary arteries and less in
the thoracic aorta.
No aortic aneurysm or dissection.
Mild gaseous distention of the distal thoracic esophagus with
question mild wall thickening.
Large soft tissue defect is identified at the gastroesophageal
junction likely representing Nissen fundoplication though this
makes it difficult to exclude mass.
No thoracic adenopathy.

Remaining visualized portions of upper abdomen unremarkable.
Pulmonary arteries patent.
No evidence of pulmonary embolism.
Dependent atelectasis and right lower lobe.
Remaining lungs clear.
No infiltrate, pleural effusion or pneumothorax.
No acute osseous findings.
IMPRESSION: No evidence of pulmonary embolism.
Probable Nissen fundoplication accounting for density at the
gastroesophageal junction.
Question mild diffuse wall thickening of the distal thoracic
esophagus, nonspecific, can be seen with reflux disease and
infection.

## 2015-08-25 ENCOUNTER — Other Ambulatory Visit: Payer: Self-pay | Admitting: Urology

## 2015-08-27 ENCOUNTER — Other Ambulatory Visit: Payer: Self-pay | Admitting: Urology

## 2015-09-01 ENCOUNTER — Encounter (HOSPITAL_BASED_OUTPATIENT_CLINIC_OR_DEPARTMENT_OTHER): Payer: Self-pay | Admitting: *Deleted

## 2015-09-01 NOTE — Progress Notes (Signed)
NPO AFTER MN.  ARRIVE AT 1015.  NEEDS HG. 

## 2015-09-03 NOTE — H&P (Signed)
History of Present Illness            F/u - PCP Dr. Kenton Kingfisher -       1- BPH / urinary retention  -May 2010 prostate U/S 125 g prostate  -June 2011 started Avodart  -Dec 2012 stopped Avodart  -Aug 2014 hospital consult for gross hematuria, difficult Foley, urinary retention following lap Nissen fundoplication - started tamsulosin  -Sept 2014 - failed voiding trial on 2 occasions, post void of 500 cc and 800 cc respectively  -Oct 2014 TURP - path benign (57 grams), passed void trial - off meds  -May 2015 stable. Off meds.   -Aug 2016 cysto - apical fusion; pt c/o slightly weaker stream and noticed a "kink" in the urethra when he voids are ejaculates. Cont surveillance.       2- gross hematuria - He had this on 2 or 3 occasions on 10/13/2008. He went to the office on 10/14/2008 and urine had microscopic hematuria. He never had the gross hematuria before. He is a nonsmoker. No history of kidney stones. He gave up riding his bike because of perineal pain when he rode about 2 or 3 years ago. He has only had one episode of bleeding after running the last 12 months. Renal U/S Jan 2010 was normal.   -Sept 2014 normal cystoscopy, BPH  -Oct 2014 recurrent gross hematuria - TURP  -Aug 2016 - cystoscopy benign; Likely related to prostate regrowth, fusion of the apical lateral lobes. This is narrowed the prostatic urethra.       3- Elevated PSA -   -January 2010 PSA 4.58  -May 2010 prostate ultrasound and biopsy, 125 g prostate, one of 12 showed atypia otherwise negative.   -Nov 2010 PSA 13.9. , tried 2 months of trimethoprim   -May 2011 PSA 11.9 with 7% free PSA   -June 2011 started Avodart June 2011   -Oct 2011 PSA 3.37  -Jun 2012 PSA 2.17 on Avodart every other day. He had constipation and some mild decrease in his erections since he's been on Avodart. Viagra helped somewhat.  -Dec 2012 - stopped Avodart - felt like it wasnt addressing his problem of elevated PSA.    -Oct 2013 PSA 4.95 with 20% free.   -Sept 2014 normal DRE  -Oct 2014 TURP  -May 2015 PSA 2.0, nl DRE  -Jan 2016 PSA 1.51      4-impotence -   -Nov 2014 - asked about alternatives to Cialis. He has organic impotence for several years. He has a good libido and occasional premature ejaculation. He has no chest pain and takes no nitroglycerin.  -May 2015 - takes Cialis on occasion for impotence but does not need it every time. He tried Viagra in the past and needed 100 mg. He has noted when he ejaculates a sharp sensation in the tip of his penis.        Nov 2016 interval hx   Patient returns for continued management of BPH and gross hematuria.    About a week ago patient developed cloudy urine and fever. He was found to have a urinary tract infection and started on Cipro. His symptoms improved but 3 days ago he developed gross hematuria with clots again. These have cleared and he is doing better today.    UA today shows some microscopic hematuria and no bacteria.           Past Medical History Problems  1. History of Elevated prostate specific antigen (PSA) (R97.20) 2.  History of Gross hematuria (R31.0) 3. History of acute prostatitis (Z87.438) 4. History of esophageal reflux (Z87.19) 5. History of hypercholesterolemia (Z86.39) 6. History of hypertension (Z86.79) 7. History of Microscopic hematuria (R31.29) 8. History of Microscopic hematuria (R31.29)  Surgical History Problems  1. History of Appendectomy 2. History of Elbow Surgery 3. Preventive medication therapy needed (Z41.8) 4. History of Rotator Cuff Repair 5. History of Surgery Vas Deferens Vasectomy 6. History of Transurethral Resection Of Prostate (TURP)  Current Meds 1. Aspirin 81 MG TABS;  Therapy: (Recorded:11May2010) to Recorded 2. Charcoal CAPS;  Therapy: (Recorded:31Aug2016) to Recorded 3. Motrin TABS;  Therapy: (Recorded:12Nov2014) to Recorded 4. Promethazine HCl TABS;   Therapy: (Recorded:31Aug2016) to Recorded 5. Pycnogenol TABS;  Therapy: (Recorded:31Aug2016) to Recorded  Allergies Medication  1. Betadine SOLN 2. Penicillins Non-Medication  3. Adhesive Tape  Family History Problems  1. Family history of Acute Myocardial Infarction : Father   died at 44 2. Family history of Acute Myocardial Infarction : Mother   died at 22 3. Family history of Family Health Status Number Of Children   2 sons  Social History Problems  1. Alcohol Use (History)   1-2 day at dinner 2. Caffeine Use   4-5 day of coffee, tea or cola 3. Marital History - Currently Married 4. Never A Smoker 5. Occupation:   clinical, social work 15. Denied: History of Smoking Cigarettes  Vitals Vital Signs [Data Includes: Last 1 Day]  Recorded: [id number] 11:22AM  Blood Pressure: 162 / 78 Temperature: 97.8 F Heart Rate: 71  Physical Exam Constitutional: Well nourished and well developed . No acute distress.  Neuro/Psych:. Mood and affect are appropriate.    Results/Data Urine [Data Includes: Last 1 Day]   [id number]  COLOR YELLOW   APPEARANCE CLEAR   SPECIFIC GRAVITY 1.015   pH 6.5   GLUCOSE NEGATIVE   BILIRUBIN NEGATIVE   KETONE NEGATIVE   BLOOD 1+   PROTEIN NEGATIVE   NITRITE NEGATIVE   LEUKOCYTE ESTERASE NEGATIVE   SQUAMOUS EPITHELIAL/HPF NONE SEEN HPF  WBC NONE SEEN WBC/HPF  RBC 10-20 RBC/HPF  BACTERIA NONE SEEN HPF  CRYSTALS NONE SEEN HPF  CASTS NONE SEEN LPF  Yeast NONE SEEN HPF   Assessment Assessed  1. Benign localized prostatic hyperplasia with lower urinary tract symptoms (LUTS) (N40.1) 2. Gross hematuria (R31.0)  Plan Gross hematuria  1. Follow-up Keep Future Appt Office  Follow-up  Status: Complete  Done: [id number] 2. Follow-up Keep Future Appt Office  Follow-up  Status: Hold For - Appointment  Requested  for: [phone] 3. URINE CULTURE; Status:In Progress - Specimen/Data Collected;   Done: [id number] Health Maintenance  4. UA With  REFLEX; [Do Not Release]; Status:Resulted - Requires Verification;   [id number] 10:33AM  Discussion/Summary        2    Gross hematuria - likely related to urinary tract infection and BPH. It has cleared. We'll get a renal ultrasound when he returns and a postvoid to complete the eval. I sent urine for Cx to ensure he doesn't need further abx.     BPH - assess as above and we will repeat DRE and PSA when he returns. I don't want to perform today given his recent infection and gross hematuria. We discussed again I believe he would do well with a repeat staged TURP. We discussed a staged procedure is not uncommon with a large prostate (> 100 g) and we discussed fusion and some narrowing of the prostatic urethra is common after TURP as  well. We discussed some  of the risks of TURP including bleeding, infection, stricture formation, incontinence and erectile dysfunction among others. We discussed some 1  alternatives and again the nature risk and benefits of alpha blockers or 5 alpha reductase inhibitors. All questions answered. He'd like to give it some thought.     Addendum: Patient called back to schedule a TURP of residual regrowth. I did want to add I discussed with him typical postop course of going home with Foley catheter or possible need for admission for CBI. He seemed a little surprised that he would need another procedure and I did offer and encourage him to get a second opinion if he had any reservations.       1 Amended By: Festus Aloe; Aug 20 2015 12:32 PM EST  2 Amended By: Festus Aloe; Aug 21 2015 9:29 AM EST  Signatures Electronically signed by : Festus Aloe, M.D.; Aug 21 2015  9:30AM EST  Add: Urine Cx -- no growth

## 2015-09-05 ENCOUNTER — Ambulatory Visit (HOSPITAL_BASED_OUTPATIENT_CLINIC_OR_DEPARTMENT_OTHER): Payer: BLUE CROSS/BLUE SHIELD | Admitting: Anesthesiology

## 2015-09-05 ENCOUNTER — Ambulatory Visit (HOSPITAL_BASED_OUTPATIENT_CLINIC_OR_DEPARTMENT_OTHER)
Admission: RE | Admit: 2015-09-05 | Discharge: 2015-09-05 | Disposition: A | Discharge status: Home / Self Care | Payer: BLUE CROSS/BLUE SHIELD | Source: Ambulatory Visit | Attending: Urology | Admitting: Urology

## 2015-09-05 ENCOUNTER — Encounter (HOSPITAL_BASED_OUTPATIENT_CLINIC_OR_DEPARTMENT_OTHER): Payer: Self-pay | Admitting: *Deleted

## 2015-09-05 ENCOUNTER — Encounter (HOSPITAL_BASED_OUTPATIENT_CLINIC_OR_DEPARTMENT_OTHER)
Admission: RE | Disposition: A | Discharge status: Home / Self Care | Payer: Self-pay | Source: Ambulatory Visit | Attending: Urology

## 2015-09-05 DIAGNOSIS — Z7982 Long term (current) use of aspirin: Secondary | ICD-10-CM | POA: Insufficient documentation

## 2015-09-05 DIAGNOSIS — N390 Urinary tract infection, site not specified: Secondary | ICD-10-CM | POA: Diagnosis not present

## 2015-09-05 DIAGNOSIS — R338 Other retention of urine: Secondary | ICD-10-CM | POA: Insufficient documentation

## 2015-09-05 DIAGNOSIS — N521 Erectile dysfunction due to diseases classified elsewhere: Secondary | ICD-10-CM | POA: Diagnosis not present

## 2015-09-05 DIAGNOSIS — Z9049 Acquired absence of other specified parts of digestive tract: Secondary | ICD-10-CM | POA: Insufficient documentation

## 2015-09-05 DIAGNOSIS — N138 Other obstructive and reflux uropathy: Secondary | ICD-10-CM | POA: Diagnosis not present

## 2015-09-05 DIAGNOSIS — N401 Enlarged prostate with lower urinary tract symptoms: Secondary | ICD-10-CM | POA: Insufficient documentation

## 2015-09-05 DIAGNOSIS — E7800 Pure hypercholesterolemia, unspecified: Secondary | ICD-10-CM | POA: Insufficient documentation

## 2015-09-05 DIAGNOSIS — Z79899 Other long term (current) drug therapy: Secondary | ICD-10-CM | POA: Diagnosis not present

## 2015-09-05 DIAGNOSIS — I10 Essential (primary) hypertension: Secondary | ICD-10-CM | POA: Insufficient documentation

## 2015-09-05 DIAGNOSIS — R972 Elevated prostate specific antigen [PSA]: Secondary | ICD-10-CM | POA: Diagnosis not present

## 2015-09-05 DIAGNOSIS — K219 Gastro-esophageal reflux disease without esophagitis: Secondary | ICD-10-CM | POA: Diagnosis not present

## 2015-09-05 DIAGNOSIS — R310 Gross hematuria: Secondary | ICD-10-CM | POA: Diagnosis not present

## 2015-09-05 HISTORY — DX: Personal history of other specified conditions: Z87.898

## 2015-09-05 HISTORY — DX: Personal history of other diseases of the digestive system: Z87.19

## 2015-09-05 HISTORY — PX: TRANSURETHRAL RESECTION OF PROSTATE: SHX73

## 2015-09-05 HISTORY — DX: Gross hematuria: R31.0

## 2015-09-05 HISTORY — DX: Benign prostatic hyperplasia without lower urinary tract symptoms: N40.0

## 2015-09-05 LAB — POCT HEMOGLOBIN-HEMACUE: HEMOGLOBIN: 15.4 g/dL (ref 13.0–17.0)

## 2015-09-05 SURGERY — TURP (TRANSURETHRAL RESECTION OF PROSTATE)
Anesthesia: General

## 2015-09-05 MED ORDER — SODIUM CHLORIDE 0.9 % IR SOLN
Status: DC | PRN
  Administered 2015-09-05: 12000 mL via INTRAVESICAL

## 2015-09-05 MED ORDER — OXYCODONE-ACETAMINOPHEN 5-325 MG PO TABS: 1.0000 | ORAL_TABLET | ORAL | Status: DC | PRN

## 2015-09-05 MED ORDER — FENTANYL CITRATE (PF) 100 MCG/2ML IJ SOLN
INTRAMUSCULAR | Status: DC | PRN
  Administered 2015-09-05 (×10): 25 ug via INTRAVENOUS

## 2015-09-05 MED ORDER — CIPROFLOXACIN HCL 500 MG PO TABS: 500.0000 mg | ORAL_TABLET | Freq: Every day | ORAL | Status: DC

## 2015-09-05 MED ORDER — FENTANYL CITRATE (PF) 250 MCG/5ML IJ SOLN
INTRAMUSCULAR | Status: AC
  Filled 2015-09-05: qty 5

## 2015-09-05 MED ORDER — ONDANSETRON HCL 4 MG/2ML IJ SOLN
INTRAMUSCULAR | Status: DC | PRN
  Administered 2015-09-05: 4 mg via INTRAVENOUS

## 2015-09-05 MED ORDER — LEVOFLOXACIN IN D5W 500 MG/100ML IV SOLN
500.0000 mg | Freq: Once | INTRAVENOUS | Status: AC
  Administered 2015-09-05: 500 mg via INTRAVENOUS
  Filled 2015-09-05: qty 100

## 2015-09-05 MED ORDER — PROPOFOL 10 MG/ML IV BOLUS
INTRAVENOUS | Status: AC
  Filled 2015-09-05: qty 20

## 2015-09-05 MED ORDER — PROPOFOL 10 MG/ML IV BOLUS
INTRAVENOUS | Status: DC | PRN
  Administered 2015-09-05: 200 mg via INTRAVENOUS

## 2015-09-05 MED ORDER — FENTANYL CITRATE (PF) 100 MCG/2ML IJ SOLN
25.0000 ug | INTRAMUSCULAR | Status: DC | PRN
  Filled 2015-09-05: qty 1

## 2015-09-05 MED ORDER — ONDANSETRON HCL 4 MG/2ML IJ SOLN
4.0000 mg | Freq: Once | INTRAMUSCULAR | Status: DC | PRN
  Filled 2015-09-05: qty 2

## 2015-09-05 MED ORDER — LEVOFLOXACIN IN D5W 500 MG/100ML IV SOLN
INTRAVENOUS | Status: AC
  Filled 2015-09-05: qty 100

## 2015-09-05 MED ORDER — LIDOCAINE HCL (CARDIAC) 20 MG/ML IV SOLN
INTRAVENOUS | Status: AC
  Filled 2015-09-05: qty 5

## 2015-09-05 MED ORDER — LACTATED RINGERS IV SOLN
INTRAVENOUS | Status: DC
  Administered 2015-09-05 (×2): via INTRAVENOUS
  Filled 2015-09-05: qty 1000

## 2015-09-05 MED ORDER — ASPIRIN EC 81 MG PO TBEC: 81.0000 mg | DELAYED_RELEASE_TABLET | Freq: Every day | ORAL | Status: DC

## 2015-09-05 MED ORDER — CEFAZOLIN SODIUM 1-5 GM-% IV SOLN
1.0000 g | Freq: Once | INTRAVENOUS | Status: DC
  Filled 2015-09-05: qty 50

## 2015-09-05 MED ORDER — LIDOCAINE HCL (CARDIAC) 20 MG/ML IV SOLN
INTRAVENOUS | Status: DC | PRN
  Administered 2015-09-05: 100 mg via INTRAVENOUS

## 2015-09-05 MED ORDER — ONDANSETRON HCL 4 MG/2ML IJ SOLN
INTRAMUSCULAR | Status: AC
  Filled 2015-09-05: qty 2

## 2015-09-05 SURGICAL SUPPLY — 32 items
BAG DRAIN URO-CYSTO SKYTR STRL (DRAIN) ×3 IMPLANT
BAG DRN ANRFLXCHMBR STRAP LEK (BAG)
BAG DRN UROCATH (DRAIN) ×1
BAG URINE DRAINAGE (UROLOGICAL SUPPLIES) ×3 IMPLANT
BAG URINE LEG 19OZ MD ST LTX (BAG) IMPLANT
BLADE SURG 15 STRL LF DISP TIS (BLADE) IMPLANT
BLADE SURG 15 STRL SS (BLADE)
CANISTER SUCT LVC 12 LTR MEDI- (MISCELLANEOUS) IMPLANT
CATH HEMA 3WAY 30CC 24FR COUDE (CATHETERS) IMPLANT
CATH HEMA 3WAY 30CC 24FR RND (CATHETERS) IMPLANT
CATH SILICONE 5CC 18FR (INSTRUMENTS) ×3 IMPLANT
CLOTH BEACON ORANGE TIMEOUT ST (SAFETY) ×3 IMPLANT
ELECT BUTTON BIOP 24F 90D PLAS (MISCELLANEOUS) IMPLANT
ELECT REM PT RETURN 9FT ADLT (ELECTROSURGICAL) ×3
ELECTRODE REM PT RTRN 9FT ADLT (ELECTROSURGICAL) ×1 IMPLANT
EVACUATOR MICROVAS BLADDER (UROLOGICAL SUPPLIES) IMPLANT
GLOVE BIO SURGEON STRL SZ7.5 (GLOVE) ×3 IMPLANT
GOWN STRL REUS W/ TWL XL LVL3 (GOWN DISPOSABLE) ×1 IMPLANT
GOWN STRL REUS W/TWL XL LVL3 (GOWN DISPOSABLE) ×2
HOLDER FOLEY CATH W/STRAP (MISCELLANEOUS) IMPLANT
IV NS IRRIG 3000ML ARTHROMATIC (IV SOLUTION) ×12 IMPLANT
KIT ROOM TURNOVER WOR (KITS) ×3 IMPLANT
KIT SUPRAPUBIC CATH (MISCELLANEOUS) IMPLANT
LOOP CUT BIPOLAR 24F LRG (ELECTROSURGICAL) ×3 IMPLANT
MANIFOLD NEPTUNE II (INSTRUMENTS) ×3 IMPLANT
PACK CYSTO (CUSTOM PROCEDURE TRAY) ×3 IMPLANT
PLUG CATH AND CAP STER (CATHETERS) IMPLANT
SET ASPIRATION TUBING (TUBING) IMPLANT
SUT ETHILON 3 0 PS 1 (SUTURE) IMPLANT
SYR 30ML LL (SYRINGE) ×3 IMPLANT
TUBE CONNECTING 12'X1/4 (SUCTIONS)
TUBE CONNECTING 12X1/4 (SUCTIONS) IMPLANT

## 2015-09-05 NOTE — Op Note (Signed)
Preoperative diagnosis: BPH, gross hematuria, urinary tract infection Postoperative diagnosis: BPH with bladder outlet obstruction  Procedure: TURP residual  Exam under anesthesia  Surgeon: Junious Silk  Anesthesia: Gen.  Indication for procedure: 69 year old with a large prostate underwent TURP 2 years ago for retention and gross hematuria. He had recurrent gross hematuria over the past couple months with urinary tract infection. On cystoscopy some residual apical tissue at appeared to have fused in the midline en bloc of prostatic urethra. He was brought for residual TURP.  Findings: On exam under anesthesia, The penis was circumcised and unremarkable. The testicles were descended bilaterally and palpably normal. the prostate was mildly enlarged, but smooth without hard area or nodule.   On cystoscopy the urethra appeared normal, there was residual lateral lobe tissue extending from the mid prostatic urethra down to the apex that had fused in the midline and obstructed the prostatic urethra. There was a narrow window to get above this tissue over it and to a patent bladder neck. The bladder itself was unremarkable. The bladder neck was widely patent. The trigone and ureteral orifices were in their normal orthotopic position. There was clear efflux. The mucosa was normal. There were no stones or foreign bodies in the bladder.  Description of procedure: After consent was obtained patient brought to the operating room. After adequate anesthesia he is placed in lithotomy position and prepped and draped in usual sterile fashion. Cystoscope was passed per urethra and negotiated into the bladder. The bladder was inspected. The bladder was irrigated a few 2 place clear, clean irrigant. Scope was removed and the resectoscope sheath was passed with the visual obturator. Resectoscope handle was placed in the loop was used to resect the prostatic tissue which was done from the mid prostatic urethra down to the  apex. I started in the midline to bring the incision down toward the apex. Then went right lateral and then left lateral. I then went back to the midline for some residual tissue. I did leave a small amount of tissue apically to avoid resection near the pelvic floor. This created excellent channel. Hemostasis was excellent. A periodically evacuated the chips and provided hemostasis. With low pressure the hemostasis was excellent. The bladder was filled and the scope removed. An 31 French Silastic catheter was placed without difficulty. The balloon was inflated with 14 mL and seated at the bladder neck. Urine drainage was clear. He was awakened taken to recovery room in stable condition.  Complications: None  Blood loss: Minimal  Specimens: TURP chips to pathology  Drains: 18 French Foley catheter

## 2015-09-05 NOTE — Transfer of Care (Signed)
Immediate Anesthesia Transfer of Care Note  Patient: Lee Collins  Procedure(s) Performed: Procedure(s) (LRB): TRANSURETHRAL RESECTION OF THE PROSTATE STAGED (TURP) (N/A)  Patient Location: PACU  Anesthesia Type: General  Level of Consciousness: awake, sedated, patient cooperative and responds to stimulation  Airway & Oxygen Therapy: Patient Spontanous Breathing and Patient connected to face mask oxygen  Post-op Assessment: Report given to PACU RN, Post -op Vital signs reviewed and stable and Patient moving all extremities  Post vital signs: Reviewed and stable  Complications: No apparent anesthesia complications

## 2015-09-05 NOTE — Discharge Instructions (Addendum)
Foley Catheter Care, Adult A Foley catheter is a soft, flexible tube. This tube is placed into your bladder to drain pee (urine). If you go home with this catheter in place, follow the instructions below. TAKING CARE OF THE CATHETER 1. Wash your hands with soap and water. 2. Put soap and water on a clean washcloth.  Clean the skin where the tube goes into your body.  Clean away from the tube site.  Never wipe toward the tube.  Clean the area using a circular motion.  Remove all the soap. Pat the area dry with a clean towel. For males, reposition the skin that covers the end of the penis (foreskin). 3. Attach the tube to your leg with tape or a leg strap. Do not stretch the tube tight. If you are using tape, remove any stickiness left behind by past tape you used. 4. Keep the drainage bag below your hips. Keep it off the floor. 5. Check your tube during the day. Make sure it is working and draining. Make sure the tube does not curl, twist, or bend. 6. Do not pull on the tube or try to take it out. TAKING CARE OF THE DRAINAGE BAGS You will have a large overnight drainage bag and a small leg bag. You may wear the overnight bag any time. Never wear the small bag at night. Follow the directions below. Emptying the Drainage Bag Empty your drainage bag when it is  - full or at least 2-3 times a day. 1. Wash your hands with soap and water. 2. Keep the drainage bag below your hips. 3. Hold the dirty bag over the toilet or clean container. 4. Open the pour spout at the bottom of the bag. Empty the pee into the toilet or container. Do not let the pour spout touch anything. 5. Clean the pour spout with a gauze pad or cotton ball that has rubbing alcohol on it. 6. Close the pour spout. 7. Attach the bag to your leg with tape or a leg strap. 8. Wash your hands well. Changing the Drainage Bag Change your bag once a month or sooner if it starts to smell or look dirty.  1. Wash your hands with soap  and water. 2. Pinch the rubber tube so that pee does not spill out. 3. Disconnect the catheter tube from the drainage tube at the connection valve. Do not let the tubes touch anything. 4. Clean the end of the catheter tube with an alcohol wipe. Clean the end of a the drainage tube with a different alcohol wipe. 5. Connect the catheter tube to the drainage tube of the clean drainage bag. 6. Attach the new bag to the leg with tape or a leg strap. Avoid attaching the new bag too tightly. 7. Wash your hands well. Cleaning the Drainage Bag 1. Wash your hands with soap and water. 2. Wash the bag in warm, soapy water. 3. Rinse the bag with warm water. 4. Fill the bag with a mixture of white vinegar and water (1 cup vinegar to 1 quart warm water [.2 liter vinegar to 1 liter warm water]). Close the bag and soak it for 30 minutes in the solution. 5. Rinse the bag with warm water. 6. Hang the bag to dry with the pour spout open and hanging downward. 7. Store the clean bag (once it is dry) in a clean plastic bag. 8. Wash your hands well. PREVENT INFECTION  Wash your hands before and after touching your tube.  Take showers every day. Wash the skin where the tube enters your body. Do not take baths. Replace wet leg straps with dry ones, if this applies.  Do not use powders, sprays, or lotions on the genital area. Only use creams, lotions, or ointments as told by your doctor.  For females, wipe from front to back after going to the bathroom.  Drink enough fluids to keep your pee clear or pale yellow unless you are told not to have too much fluid (fluid restriction).  Do not let the drainage bag or tubing touch or lie on the floor.  Wear cotton underwear to keep the area dry. GET HELP IF:  Your pee is cloudy or smells unusually bad.  Your tube becomes clogged.  You are not draining pee into the bag or your bladder feels full.  Your tube starts to leak. GET HELP RIGHT AWAY IF:  You have  pain, puffiness (swelling), redness, or yellowish-white fluid (pus) where the tube enters the body.  You have pain in the belly (abdomen), legs, lower back, or bladder.  You have a fever.  You see blood fill the tube, or your pee is pink or red.  You feel sick to your stomach (nauseous), throw up (vomit), or have chills.  Your tube gets pulled out. MAKE SURE YOU:   Understand these instructions.  Will watch your condition.  Will get help right away if you are not doing well or get worse.   This information is not intended to replace advice given to you by your health care provider. Make sure you discuss any questions you have with your health care provider.   Document Released: 01/29/2013 Document Revised: 10/25/2014 Document Reviewed: 01/29/2013 Elsevier Interactive Patient Education 2016 [place] CARE INSTRUCTIONS  Activity: Rest for the remainder of the day.  Do not drive or operate equipment today.  You may resume normal activities in one to two days as instructed by your physician.   Meals: Drink plenty of liquids and eat light foods such as gelatin or soup this evening.  You may return to a normal meal plan tomorrow.  Return to Work: You may return to work in one to two days or as instructed by your physician.  Special Instructions / Symptoms: Call your physician if any of these symptoms occur:   -persistent or heavy bleeding  -bleeding which continues after first few urination  -large blood clots that are difficult to pass  -urine stream diminishes or stops completely  -fever equal to or higher than 101 degrees Farenheit.  -cloudy urine with a strong, foul odor  -severe pain  Females should always wipe from front to back after elimination.  You may feel some burning pain when you urinate.  This should disappear with time.  Applying moist heat to the lower abdomen or a hot tub bath may help relieve the pain. \  Follow-Up / Date of Return  Visit to Your Physician:   Call for an appointment to arrange follow-up.  Patient Signature:  ________________________________________________________  Nurse's Signature:  ________________________________________________________

## 2015-09-05 NOTE — Anesthesia Procedure Notes (Signed)
Procedure Name: LMA Insertion Date/Time: 09/05/2015 11:56 AM Performed by: Justice Rocher Pre-anesthesia Checklist: Patient identified, Emergency Drugs available, Suction available and Patient being monitored Patient Re-evaluated:Patient Re-evaluated prior to inductionOxygen Delivery Method: Circle System Utilized Preoxygenation: Pre-oxygenation with 100% oxygen Intubation Type: IV induction Ventilation: Mask ventilation without difficulty LMA: LMA inserted LMA Size: 4.0 Number of attempts: 1 Airway Equipment and Method: Bite block Placement Confirmation: positive ETCO2 Tube secured with: Tape Dental Injury: Teeth and Oropharynx as per pre-operative assessment

## 2015-09-05 NOTE — Interval H&P Note (Signed)
History and Physical Interval Note:  09/05/2015 11:36 AM  Lee Collins  has presented today for surgery, with the diagnosis of BPH AND GROSS HEMATURIA   The various methods of treatment have been discussed with the patient and family. After consideration of risks, benefits and other options for treatment, the patient has consented to  Procedure(s): TRANSURETHRAL RESECTION OF THE PROSTATE STAGGED (TURP) (N/A) as a surgical intervention. Discussed need for multiple procedures, catheters and CBI. The patient's history has been reviewed, patient examined, no change in status, stable for surgery. He has been well with no fever or dysuria but had some clots on and off until three days ago. Since then urine has been clear. Last Cx negative. I have reviewed the patient's chart and labs.  Questions were answered to the patient's satisfaction.     Lee Collins

## 2015-09-05 NOTE — Anesthesia Preprocedure Evaluation (Addendum)
Anesthesia Evaluation  Patient identified by MRN, date of birth, ID band Patient awake    Reviewed: Allergy & Precautions, NPO status , Patient's Chart, lab work & pertinent test results  History of Anesthesia Complications Negative for: history of anesthetic complications  Airway Mallampati: II  TM Distance: >3 FB Neck ROM: Full    Dental no notable dental hx. (+) Dental Advisory Given   Pulmonary neg pulmonary ROS,    Pulmonary exam normal breath sounds clear to auscultation       Cardiovascular negative cardio ROS Normal cardiovascular exam Rhythm:Regular Rate:Normal     Neuro/Psych negative neurological ROS  negative psych ROS   GI/Hepatic negative GI ROS, Neg liver ROS,   Endo/Other  negative endocrine ROS  Renal/GU negative Renal ROS  negative genitourinary   Musculoskeletal negative musculoskeletal ROS (+)   Abdominal   Peds negative pediatric ROS (+)  Hematology negative hematology ROS (+)   Anesthesia Other Findings Enlarged prostate  Reproductive/Obstetrics negative OB ROS                             Anesthesia Physical Anesthesia Plan  ASA: II  Anesthesia Plan: General   Post-op Pain Management:    Induction: Intravenous  Airway Management Planned: LMA  Additional Equipment:   Intra-op Plan:   Post-operative Plan: Extubation in OR  Informed Consent: I have reviewed the patients History and Physical, chart, labs and discussed the procedure including the risks, benefits and alternatives for the proposed anesthesia with the patient or authorized representative who has indicated his/her understanding and acceptance.   Dental advisory given  Plan Discussed with: CRNA  Anesthesia Plan Comments:         Anesthesia Quick Evaluation

## 2015-09-08 ENCOUNTER — Encounter (HOSPITAL_BASED_OUTPATIENT_CLINIC_OR_DEPARTMENT_OTHER): Payer: Self-pay | Admitting: Urology

## 2015-09-08 NOTE — Anesthesia Postprocedure Evaluation (Signed)
Anesthesia Post Note  Patient: Lee Collins  Procedure(s) Performed: Procedure(s) (LRB): TRANSURETHRAL RESECTION OF THE PROSTATE STAGED (TURP) (N/A)  Anesthesia Type: General Vital Signs Assessment: post-procedure vital signs reviewed and stable Anesthetic complications: no    Last Vitals:  Filed Vitals:   09/05/15 1345 09/05/15 1430  BP:    Pulse: 70 70  Temp:  36.6 C  Resp: 15     Last Pain: There were no vitals filed for this visit.               Bern Fare JENNETTE

## 2018-01-11 DIAGNOSIS — E7800 Pure hypercholesterolemia, unspecified: Secondary | ICD-10-CM | POA: Diagnosis not present

## 2018-01-11 DIAGNOSIS — Z125 Encounter for screening for malignant neoplasm of prostate: Secondary | ICD-10-CM | POA: Diagnosis not present

## 2018-01-11 DIAGNOSIS — Z1159 Encounter for screening for other viral diseases: Secondary | ICD-10-CM | POA: Diagnosis not present

## 2018-01-11 DIAGNOSIS — H93A1 Pulsatile tinnitus, right ear: Secondary | ICD-10-CM | POA: Diagnosis not present

## 2018-01-11 DIAGNOSIS — N400 Enlarged prostate without lower urinary tract symptoms: Secondary | ICD-10-CM | POA: Diagnosis not present

## 2018-01-11 DIAGNOSIS — R110 Nausea: Secondary | ICD-10-CM | POA: Diagnosis not present

## 2018-01-11 DIAGNOSIS — Z0000 Encounter for general adult medical examination without abnormal findings: Secondary | ICD-10-CM | POA: Diagnosis not present

## 2018-01-11 DIAGNOSIS — F5101 Primary insomnia: Secondary | ICD-10-CM | POA: Diagnosis not present

## 2018-01-11 DIAGNOSIS — N529 Male erectile dysfunction, unspecified: Secondary | ICD-10-CM | POA: Diagnosis not present

## 2018-08-17 DIAGNOSIS — H2513 Age-related nuclear cataract, bilateral: Secondary | ICD-10-CM | POA: Diagnosis not present

## 2018-08-17 DIAGNOSIS — H5319 Other subjective visual disturbances: Secondary | ICD-10-CM | POA: Diagnosis not present

## 2018-08-17 DIAGNOSIS — G43809 Other migraine, not intractable, without status migrainosus: Secondary | ICD-10-CM | POA: Diagnosis not present

## 2018-09-29 DIAGNOSIS — Z86010 Personal history of colonic polyps: Secondary | ICD-10-CM | POA: Diagnosis not present

## 2018-09-29 DIAGNOSIS — D124 Benign neoplasm of descending colon: Secondary | ICD-10-CM | POA: Diagnosis not present

## 2018-09-29 DIAGNOSIS — D122 Benign neoplasm of ascending colon: Secondary | ICD-10-CM | POA: Diagnosis not present

## 2018-10-03 DIAGNOSIS — D122 Benign neoplasm of ascending colon: Secondary | ICD-10-CM | POA: Diagnosis not present

## 2018-10-03 DIAGNOSIS — D124 Benign neoplasm of descending colon: Secondary | ICD-10-CM | POA: Diagnosis not present

## 2019-01-16 DIAGNOSIS — Z0000 Encounter for general adult medical examination without abnormal findings: Secondary | ICD-10-CM | POA: Diagnosis not present

## 2019-01-16 DIAGNOSIS — Z125 Encounter for screening for malignant neoplasm of prostate: Secondary | ICD-10-CM | POA: Diagnosis not present

## 2019-01-16 DIAGNOSIS — E7800 Pure hypercholesterolemia, unspecified: Secondary | ICD-10-CM | POA: Diagnosis not present

## 2019-01-16 DIAGNOSIS — H938X1 Other specified disorders of right ear: Secondary | ICD-10-CM | POA: Diagnosis not present

## 2019-01-16 DIAGNOSIS — N400 Enlarged prostate without lower urinary tract symptoms: Secondary | ICD-10-CM | POA: Diagnosis not present

## 2019-02-28 DIAGNOSIS — E7800 Pure hypercholesterolemia, unspecified: Secondary | ICD-10-CM | POA: Diagnosis not present

## 2019-10-17 ENCOUNTER — Other Ambulatory Visit: Payer: Self-pay | Admitting: Otolaryngology

## 2019-10-17 DIAGNOSIS — H9311 Tinnitus, right ear: Secondary | ICD-10-CM

## 2019-11-13 ENCOUNTER — Ambulatory Visit: Payer: BLUE CROSS/BLUE SHIELD

## 2019-11-22 ENCOUNTER — Other Ambulatory Visit: Payer: Self-pay

## 2019-11-22 ENCOUNTER — Ambulatory Visit: Payer: BLUE CROSS/BLUE SHIELD

## 2019-11-22 ENCOUNTER — Ambulatory Visit
Admission: RE | Admit: 2019-11-22 | Discharge: 2019-11-22 | Disposition: A | Discharge status: Home / Self Care | Payer: Medicare Other | Source: Ambulatory Visit | Attending: Otolaryngology | Admitting: Otolaryngology

## 2019-11-22 DIAGNOSIS — H9191 Unspecified hearing loss, right ear: Secondary | ICD-10-CM | POA: Diagnosis not present

## 2019-11-22 DIAGNOSIS — H9311 Tinnitus, right ear: Secondary | ICD-10-CM

## 2019-11-22 MED ORDER — GADOBENATE DIMEGLUMINE 529 MG/ML IV SOLN
15.0000 mL | Freq: Once | INTRAVENOUS | Status: AC | PRN
  Administered 2019-11-22: 15 mL via INTRAVENOUS

## 2019-11-30 ENCOUNTER — Ambulatory Visit: Payer: BLUE CROSS/BLUE SHIELD

## 2019-12-31 DIAGNOSIS — Z1159 Encounter for screening for other viral diseases: Secondary | ICD-10-CM | POA: Diagnosis not present

## 2020-01-03 DIAGNOSIS — K648 Other hemorrhoids: Secondary | ICD-10-CM | POA: Diagnosis not present

## 2020-01-03 DIAGNOSIS — D122 Benign neoplasm of ascending colon: Secondary | ICD-10-CM | POA: Diagnosis not present

## 2020-01-03 DIAGNOSIS — Z86010 Personal history of colonic polyps: Secondary | ICD-10-CM | POA: Diagnosis not present

## 2020-01-03 DIAGNOSIS — K5730 Diverticulosis of large intestine without perforation or abscess without bleeding: Secondary | ICD-10-CM | POA: Diagnosis not present

## 2020-01-08 DIAGNOSIS — D122 Benign neoplasm of ascending colon: Secondary | ICD-10-CM | POA: Diagnosis not present

## 2020-01-17 DIAGNOSIS — J309 Allergic rhinitis, unspecified: Secondary | ICD-10-CM | POA: Diagnosis not present

## 2020-01-17 DIAGNOSIS — G43109 Migraine with aura, not intractable, without status migrainosus: Secondary | ICD-10-CM | POA: Diagnosis not present

## 2020-01-17 DIAGNOSIS — N400 Enlarged prostate without lower urinary tract symptoms: Secondary | ICD-10-CM | POA: Diagnosis not present

## 2020-01-17 DIAGNOSIS — E7800 Pure hypercholesterolemia, unspecified: Secondary | ICD-10-CM | POA: Diagnosis not present

## 2020-01-17 DIAGNOSIS — Z125 Encounter for screening for malignant neoplasm of prostate: Secondary | ICD-10-CM | POA: Diagnosis not present

## 2020-01-17 DIAGNOSIS — Z0000 Encounter for general adult medical examination without abnormal findings: Secondary | ICD-10-CM | POA: Diagnosis not present

## 2020-03-25 DIAGNOSIS — M25561 Pain in right knee: Secondary | ICD-10-CM | POA: Diagnosis not present

## 2020-07-28 DIAGNOSIS — M2042 Other hammer toe(s) (acquired), left foot: Secondary | ICD-10-CM | POA: Diagnosis not present

## 2020-07-28 DIAGNOSIS — M79672 Pain in left foot: Secondary | ICD-10-CM | POA: Diagnosis not present

## 2020-07-28 DIAGNOSIS — M79671 Pain in right foot: Secondary | ICD-10-CM | POA: Diagnosis not present

## 2020-07-28 DIAGNOSIS — M7742 Metatarsalgia, left foot: Secondary | ICD-10-CM | POA: Diagnosis not present

## 2020-07-28 DIAGNOSIS — M2022 Hallux rigidus, left foot: Secondary | ICD-10-CM | POA: Diagnosis not present

## 2020-07-29 ENCOUNTER — Ambulatory Visit: Payer: Medicare Other

## 2020-08-04 ENCOUNTER — Other Ambulatory Visit (HOSPITAL_COMMUNITY): Payer: Self-pay | Admitting: Orthopedic Surgery

## 2020-08-07 ENCOUNTER — Other Ambulatory Visit: Payer: Self-pay

## 2020-08-07 ENCOUNTER — Encounter (HOSPITAL_BASED_OUTPATIENT_CLINIC_OR_DEPARTMENT_OTHER): Payer: Self-pay | Admitting: Orthopedic Surgery

## 2020-08-08 NOTE — Progress Notes (Signed)

## 2020-08-11 ENCOUNTER — Other Ambulatory Visit (HOSPITAL_COMMUNITY)
Admission: RE | Admit: 2020-08-11 | Discharge: 2020-08-11 | Disposition: A | Discharge status: Home / Self Care | Payer: Medicare Other | Source: Ambulatory Visit | Attending: Orthopedic Surgery | Admitting: Orthopedic Surgery

## 2020-08-11 DIAGNOSIS — Z01812 Encounter for preprocedural laboratory examination: Secondary | ICD-10-CM | POA: Diagnosis not present

## 2020-08-11 DIAGNOSIS — Z20822 Contact with and (suspected) exposure to covid-19: Secondary | ICD-10-CM | POA: Diagnosis not present

## 2020-08-11 LAB — SARS CORONAVIRUS 2 (TAT 6-24 HRS): SARS Coronavirus 2: NEGATIVE

## 2020-08-14 ENCOUNTER — Encounter (HOSPITAL_BASED_OUTPATIENT_CLINIC_OR_DEPARTMENT_OTHER)
Admission: RE | Disposition: A | Discharge status: Home / Self Care | Payer: Self-pay | Source: Home / Self Care | Attending: Orthopedic Surgery

## 2020-08-14 ENCOUNTER — Ambulatory Visit (HOSPITAL_BASED_OUTPATIENT_CLINIC_OR_DEPARTMENT_OTHER): Payer: Medicare Other | Admitting: Certified Registered"

## 2020-08-14 ENCOUNTER — Ambulatory Visit (HOSPITAL_BASED_OUTPATIENT_CLINIC_OR_DEPARTMENT_OTHER)
Admission: RE | Admit: 2020-08-14 | Discharge: 2020-08-14 | Disposition: A | Discharge status: Home / Self Care | Payer: Medicare Other | Attending: Orthopedic Surgery | Admitting: Orthopedic Surgery

## 2020-08-14 ENCOUNTER — Encounter (HOSPITAL_BASED_OUTPATIENT_CLINIC_OR_DEPARTMENT_OTHER): Payer: Self-pay | Admitting: Orthopedic Surgery

## 2020-08-14 ENCOUNTER — Other Ambulatory Visit: Payer: Self-pay

## 2020-08-14 DIAGNOSIS — Z880 Allergy status to penicillin: Secondary | ICD-10-CM | POA: Insufficient documentation

## 2020-08-14 DIAGNOSIS — Z91040 Latex allergy status: Secondary | ICD-10-CM | POA: Diagnosis not present

## 2020-08-14 DIAGNOSIS — M2042 Other hammer toe(s) (acquired), left foot: Secondary | ICD-10-CM | POA: Diagnosis not present

## 2020-08-14 DIAGNOSIS — Z888 Allergy status to other drugs, medicaments and biological substances status: Secondary | ICD-10-CM | POA: Insufficient documentation

## 2020-08-14 DIAGNOSIS — M7742 Metatarsalgia, left foot: Secondary | ICD-10-CM | POA: Diagnosis not present

## 2020-08-14 DIAGNOSIS — M2022 Hallux rigidus, left foot: Secondary | ICD-10-CM | POA: Diagnosis not present

## 2020-08-14 DIAGNOSIS — G8918 Other acute postprocedural pain: Secondary | ICD-10-CM | POA: Diagnosis not present

## 2020-08-14 HISTORY — PX: ARTHRODESIS METATARSALPHALANGEAL JOINT (MTPJ): SHX6566

## 2020-08-14 HISTORY — PX: HAMMERTOE RECONSTRUCTION WITH WEIL OSTEOTOMY: SHX5631

## 2020-08-14 SURGERY — FUSION, JOINT, GREAT TOE
Anesthesia: General | Site: Toe | Laterality: Left

## 2020-08-14 MED ORDER — PROPOFOL 500 MG/50ML IV EMUL
INTRAVENOUS | Status: DC | PRN
  Administered 2020-08-14: 25 ug/kg/min via INTRAVENOUS

## 2020-08-14 MED ORDER — DEXAMETHASONE SODIUM PHOSPHATE 10 MG/ML IJ SOLN
INTRAMUSCULAR | Status: DC | PRN
  Administered 2020-08-14: 4 mg via INTRAVENOUS

## 2020-08-14 MED ORDER — OXYCODONE HCL 5 MG PO TABS: 5.0000 mg | ORAL_TABLET | Freq: Once | ORAL | Status: DC | PRN

## 2020-08-14 MED ORDER — SODIUM CHLORIDE 0.9 % IV SOLN: INTRAVENOUS | Status: DC

## 2020-08-14 MED ORDER — PROPOFOL 10 MG/ML IV BOLUS
INTRAVENOUS | Status: DC | PRN
  Administered 2020-08-14: 150 mg via INTRAVENOUS

## 2020-08-14 MED ORDER — 0.9 % SODIUM CHLORIDE (POUR BTL) OPTIME
TOPICAL | Status: DC | PRN
  Administered 2020-08-14: 120 mL

## 2020-08-14 MED ORDER — FENTANYL CITRATE (PF) 100 MCG/2ML IJ SOLN
INTRAMUSCULAR | Status: AC
  Filled 2020-08-14: qty 2

## 2020-08-14 MED ORDER — FENTANYL CITRATE (PF) 100 MCG/2ML IJ SOLN
100.0000 ug | Freq: Once | INTRAMUSCULAR | Status: AC
  Administered 2020-08-14: 100 ug via INTRAVENOUS

## 2020-08-14 MED ORDER — OXYCODONE HCL 5 MG PO TABS
5.0000 mg | ORAL_TABLET | Freq: Four times a day (QID) | ORAL | 0 refills | Status: AC | PRN
Start: 2020-08-14 — End: 2020-08-17

## 2020-08-14 MED ORDER — ACETAMINOPHEN 10 MG/ML IV SOLN: 1000.0000 mg | Freq: Once | INTRAVENOUS | Status: DC | PRN

## 2020-08-14 MED ORDER — VANCOMYCIN HCL 500 MG IV SOLR
INTRAVENOUS | Status: DC | PRN
  Administered 2020-08-14: 500 mg via TOPICAL

## 2020-08-14 MED ORDER — ONDANSETRON HCL 4 MG/2ML IJ SOLN
INTRAMUSCULAR | Status: DC | PRN
  Administered 2020-08-14: 4 mg via INTRAVENOUS

## 2020-08-14 MED ORDER — OXYCODONE HCL 5 MG/5ML PO SOLN: 5.0000 mg | Freq: Once | ORAL | Status: DC | PRN

## 2020-08-14 MED ORDER — FENTANYL CITRATE (PF) 100 MCG/2ML IJ SOLN: 25.0000 ug | INTRAMUSCULAR | Status: DC | PRN

## 2020-08-14 MED ORDER — ROPIVACAINE HCL 5 MG/ML IJ SOLN
INTRAMUSCULAR | Status: DC | PRN
  Administered 2020-08-14: 20 mL

## 2020-08-14 MED ORDER — CEFAZOLIN SODIUM-DEXTROSE 2-4 GM/100ML-% IV SOLN
INTRAVENOUS | Status: AC
  Filled 2020-08-14: qty 100

## 2020-08-14 MED ORDER — MIDAZOLAM HCL 2 MG/2ML IJ SOLN
2.0000 mg | Freq: Once | INTRAMUSCULAR | Status: AC
  Administered 2020-08-14: 1 mg via INTRAVENOUS

## 2020-08-14 MED ORDER — LIDOCAINE HCL (PF) 2 % IJ SOLN
INTRAMUSCULAR | Status: DC | PRN
  Administered 2020-08-14: 200 mg

## 2020-08-14 MED ORDER — LIDOCAINE 2% (20 MG/ML) 5 ML SYRINGE
INTRAMUSCULAR | Status: DC | PRN
  Administered 2020-08-14: 60 mg via INTRAVENOUS

## 2020-08-14 MED ORDER — VANCOMYCIN HCL 500 MG IV SOLR
INTRAVENOUS | Status: AC
  Filled 2020-08-14: qty 500

## 2020-08-14 MED ORDER — MIDAZOLAM HCL 2 MG/2ML IJ SOLN
INTRAMUSCULAR | Status: AC
  Filled 2020-08-14: qty 2

## 2020-08-14 MED ORDER — LACTATED RINGERS IV SOLN: INTRAVENOUS | Status: DC

## 2020-08-14 MED ORDER — CEFAZOLIN SODIUM-DEXTROSE 2-4 GM/100ML-% IV SOLN
2.0000 g | INTRAVENOUS | Status: AC
  Administered 2020-08-14: 2 g via INTRAVENOUS

## 2020-08-14 SURGICAL SUPPLY — 103 items
BANDAGE ESMARK 6X9 LF (GAUZE/BANDAGES/DRESSINGS) IMPLANT
BIT DRILL 2.7XCANN QCK CNCT (BIT) ×2 IMPLANT
BIT DRILL CANN 2.4 (BIT) ×3
BIT DRILL CANN 2.4MM (BIT) ×1
BIT DRILL CANN 2.7 (BIT) ×3
BIT DRILL CANN 2.7MM (BIT) ×1
BIT DRILL CANN MAX VPC 2.4 (BIT) ×2 IMPLANT
BIT DRILL Q-C 2.0 DIA 100 (BIT) ×3 IMPLANT
BIT DRILL Q-C 2.0MM DIA 100MM (BIT) ×1
BIT DRL 2.7XCANN QCK CNCT (BIT) ×2
BLADE AVERAGE 25MMX9MM (BLADE)
BLADE AVERAGE 25X9 (BLADE) IMPLANT
BLADE LONG MED 25X9 (BLADE) ×3 IMPLANT
BLADE LONG MED 25X9MM (BLADE) ×1
BLADE MICRO SAGITTAL (BLADE) IMPLANT
BLADE OSC/SAG .038X5.5 CUT EDG (BLADE) IMPLANT
BLADE SURG 15 STRL LF DISP TIS (BLADE) ×6 IMPLANT
BLADE SURG 15 STRL SS (BLADE) ×12
BNDG CMPR 9X4 STRL LF SNTH (GAUZE/BANDAGES/DRESSINGS)
BNDG COHESIVE 4X5 TAN STRL (GAUZE/BANDAGES/DRESSINGS) IMPLANT
BNDG COHESIVE 6X5 TAN STRL LF (GAUZE/BANDAGES/DRESSINGS) IMPLANT
BNDG CONFORM 2 STRL LF (GAUZE/BANDAGES/DRESSINGS) IMPLANT
BNDG CONFORM 3 STRL LF (GAUZE/BANDAGES/DRESSINGS) ×4 IMPLANT
BNDG ELASTIC 4X5.8 VLCR STR LF (GAUZE/BANDAGES/DRESSINGS) ×4 IMPLANT
BNDG ESMARK 4X9 LF (GAUZE/BANDAGES/DRESSINGS) IMPLANT
BNDG ESMARK 6X9 LF (GAUZE/BANDAGES/DRESSINGS)
BOOT STEPPER DURA LG (SOFTGOODS) ×4 IMPLANT
BOOT STEPPER DURA MED (SOFTGOODS) IMPLANT
BOOT STEPPER DURA SM (SOFTGOODS) IMPLANT
BOOT STEPPER DURA XLG (SOFTGOODS) IMPLANT
CAP PIN PROTECTOR ORTHO WHT (CAP) IMPLANT
CHLORAPREP W/TINT 26 (MISCELLANEOUS) ×4 IMPLANT
COVER BACK TABLE 60X90IN (DRAPES) ×4 IMPLANT
COVER WAND RF STERILE (DRAPES) IMPLANT
CUFF TOURN SGL QUICK 34 (TOURNIQUET CUFF) ×4
CUFF TRNQT CYL 34X4.125X (TOURNIQUET CUFF) ×2 IMPLANT
DRAPE EXTREMITY T 121X128X90 (DISPOSABLE) ×4 IMPLANT
DRAPE OEC MINIVIEW 54X84 (DRAPES) ×4 IMPLANT
DRAPE U-SHAPE 47X51 STRL (DRAPES) ×4 IMPLANT
DRSG MEPITEL 4X7.2 (GAUZE/BANDAGES/DRESSINGS) ×4 IMPLANT
DRSG PAD ABDOMINAL 8X10 ST (GAUZE/BANDAGES/DRESSINGS) ×4 IMPLANT
ELECT REM PT RETURN 9FT ADLT (ELECTROSURGICAL) ×4
ELECTRODE REM PT RTRN 9FT ADLT (ELECTROSURGICAL) ×2 IMPLANT
GAUZE SPONGE 4X4 12PLY STRL (GAUZE/BANDAGES/DRESSINGS) ×4 IMPLANT
GLOVE BIOGEL PI IND STRL 8 (GLOVE) ×4 IMPLANT
GLOVE BIOGEL PI INDICATOR 8 (GLOVE) ×4
GLOVE SURG SS PI 7.0 STRL IVOR (GLOVE) ×8 IMPLANT
GLOVE SURG SYN 8.0 (GLOVE) ×8 IMPLANT
GOWN STRL REUS W/ TWL LRG LVL3 (GOWN DISPOSABLE) ×2 IMPLANT
GOWN STRL REUS W/ TWL XL LVL3 (GOWN DISPOSABLE) ×4 IMPLANT
GOWN STRL REUS W/TWL LRG LVL3 (GOWN DISPOSABLE) ×4
GOWN STRL REUS W/TWL XL LVL3 (GOWN DISPOSABLE) ×8
GUIDEWIRE PIN ORTH 6X1.6XSMTH (WIRE) ×2 IMPLANT
K-WIRE .054X4 (WIRE) IMPLANT
K-WIRE 1.6 (WIRE) ×4
K-WIRE COCR 1.1X105 (WIRE) ×4
KWIRE COCR 1.1X105 (WIRE) ×2 IMPLANT
NEEDLE HYPO 22GX1.5 SAFETY (NEEDLE) IMPLANT
NS IRRIG 1000ML POUR BTL (IV SOLUTION) ×4 IMPLANT
PACK BASIN DAY SURGERY FS (CUSTOM PROCEDURE TRAY) ×4 IMPLANT
PAD CAST 4YDX4 CTTN HI CHSV (CAST SUPPLIES) ×2 IMPLANT
PADDING CAST ABS 4INX4YD NS (CAST SUPPLIES)
PADDING CAST ABS COTTON 4X4 ST (CAST SUPPLIES) IMPLANT
PADDING CAST COTTON 4X4 STRL (CAST SUPPLIES) ×4
PADDING CAST COTTON 6X4 STRL (CAST SUPPLIES) IMPLANT
PASSER SUT SWANSON 36MM LOOP (INSTRUMENTS) IMPLANT
PENCIL SMOKE EVACUATOR (MISCELLANEOUS) ×4 IMPLANT
PLATE TUB 39 W/COLLAR 5H (Plate) ×4 IMPLANT
Polyisoprene undergloves ×4 IMPLANT
SANITIZER HAND PURELL 535ML FO (MISCELLANEOUS) ×4 IMPLANT
SCREW CANN 1/3 THRD RVRS CT (Screw) ×2 IMPLANT
SCREW CANN MAX VPC 3.4X20 (Screw) ×4 IMPLANT
SCREW CANNULATED 4.0X40 (Screw) ×4 IMPLANT
SCREW CORT 2.5X20X2.7XST SM (Screw) ×2 IMPLANT
SCREW CORT SM HEX FT 2.7X24 (Screw) ×4 IMPLANT
SCREW CORTICAL 2.7X18MM (Screw) ×4 IMPLANT
SCREW CORTICAL 2.7X20MM (Screw) ×4 IMPLANT
SCREW CORTICAL LOCK 2.7X22 (Screw) ×4 IMPLANT
SCREW HCS TWIST-OFF 2.0X12MM (Screw) ×8 IMPLANT
SCREW VPS 3.4X20MM (Screw) ×8 IMPLANT
SHEET MEDIUM DRAPE 40X70 STRL (DRAPES) ×4 IMPLANT
SLEEVE SCD COMPRESS KNEE MED (MISCELLANEOUS) ×4 IMPLANT
SPLINT FAST PLASTER 5X30 (CAST SUPPLIES)
SPLINT PLASTER CAST FAST 5X30 (CAST SUPPLIES) IMPLANT
SPONGE LAP 18X18 RF (DISPOSABLE) ×4 IMPLANT
SPONGE SURGIFOAM ABS GEL 12-7 (HEMOSTASIS) IMPLANT
STOCKINETTE 6  STRL (DRAPES) ×4
STOCKINETTE 6 STRL (DRAPES) ×2 IMPLANT
SUCTION FRAZIER HANDLE 10FR (MISCELLANEOUS) ×4
SUCTION TUBE FRAZIER 10FR DISP (MISCELLANEOUS) ×2 IMPLANT
SUT ETHILON 3 0 PS 1 (SUTURE) ×8 IMPLANT
SUT MNCRL AB 3-0 PS2 18 (SUTURE) ×4 IMPLANT
SUT VIC AB 0 SH 27 (SUTURE) IMPLANT
SUT VIC AB 2-0 SH 27 (SUTURE) ×4
SUT VIC AB 2-0 SH 27XBRD (SUTURE) ×2 IMPLANT
SUT VICRYL 0 UR6 27IN ABS (SUTURE) IMPLANT
SYR BULB EAR ULCER 3OZ GRN STR (SYRINGE) ×4 IMPLANT
SYR CONTROL 10ML LL (SYRINGE) IMPLANT
TOWEL GREEN STERILE FF (TOWEL DISPOSABLE) ×8 IMPLANT
TUBE CONNECTING 20'X1/4 (TUBING) ×1
TUBE CONNECTING 20X1/4 (TUBING) ×3 IMPLANT
UNDERPAD 30X36 HEAVY ABSORB (UNDERPADS AND DIAPERS) ×4 IMPLANT
YANKAUER SUCT BULB TIP NO VENT (SUCTIONS) IMPLANT

## 2020-08-14 NOTE — Progress Notes (Signed)
Assisted Dr. Kalman Shan with left, ankle block. Side rails up, monitors on throughout procedure. See vital signs in flow sheet. Tolerated Procedure well.

## 2020-08-14 NOTE — Anesthesia Procedure Notes (Signed)
Procedure Name: LMA Insertion Date/Time: 08/14/2020 12:15 PM Performed by: Signe Colt, CRNA Pre-anesthesia Checklist: Patient identified, Emergency Drugs available, Suction available and Patient being monitored Patient Re-evaluated:Patient Re-evaluated prior to induction Oxygen Delivery Method: Circle System Utilized Preoxygenation: Pre-oxygenation with 100% oxygen Induction Type: IV induction Ventilation: Mask ventilation without difficulty LMA: LMA inserted LMA Size: 4.0 Number of attempts: 1 Airway Equipment and Method: bite block Placement Confirmation: positive ETCO2 Tube secured with: Tape Dental Injury: Teeth and Oropharynx as per pre-operative assessment

## 2020-08-14 NOTE — Anesthesia Postprocedure Evaluation (Signed)
Anesthesia Post Note  Patient: Lee Collins  Procedure(s) Performed: Left hallux metatarsophalangeal joint arthrodesis (Left Foot) Second and Third metatarsal Weil osteotomies and hammertoe corrections (Left Toe)     Patient location during evaluation: PACU Anesthesia Type: General Level of consciousness: awake and alert Pain management: pain level controlled Vital Signs Assessment: post-procedure vital signs reviewed and stable Respiratory status: spontaneous breathing, nonlabored ventilation, respiratory function stable and patient connected to nasal cannula oxygen Cardiovascular status: blood pressure returned to baseline and stable Postop Assessment: no apparent nausea or vomiting Anesthetic complications: no   No complications documented.  Last Vitals:  Vitals:   08/14/20 1415 08/14/20 1430  BP: 129/73 131/68  Pulse: 70 70  Resp: 13 13  Temp:    SpO2: 100% 100%    Last Pain:  Vitals:   08/14/20 1415  TempSrc:   PainSc: 0-No pain                 Chandria Rookstool S

## 2020-08-14 NOTE — Op Note (Signed)
08/14/2020  1:52 PM  PATIENT:  Lee Collins  74 y.o. male  PRE-OPERATIVE DIAGNOSIS:   1.  Left hallux rigidus      2.  Left forefoot metatarsalgia      3.  Left 2nd and 3rd hammertoes  POST-OPERATIVE DIAGNOSIS:   Same  Procedure(s): 1.  Left hallux metatarsophalangeal joint arthrodesis 2.  Left 2nd and 3rd MT weil osteotomies through separate incisions 3.  Left 2nd and 3rd hammertoe corrections 4.  AP, lateral and oblique xrays of the left foot  SURGEON:  Wylene Simmer, MD  ASSISTANT: none  ANESTHESIA:   General, regional  EBL:  minimal   TOURNIQUET:  71 min at 782 mm Hg  COMPLICATIONS:  None apparent  DISPOSITION:  Extubated, awake and stable to recovery.  INDICATION FOR PROCEDURE: The patient is a 74 year old male with a long history of left forefoot pain due to hallux rigidus, metatarsalgia and second and third hammertoe deformities.  He has failed nonoperative treatment to date and presents today for surgical correction of these painful forefoot deformities.  The risks and benefits of the alternative treatment options have been discussed in detail.  The patient wishes to proceed with surgery and specifically understands risks of bleeding, infection, nerve damage, blood clots, need for additional surgery, amputation and death.  PROCEDURE IN DETAIL:  After pre operative consent was obtained, and the correct operative site was identified, the patient was brought to the operating room and placed supine on the OR table.  Anesthesia was administered.  Pre-operative antibiotics were administered.  A surgical timeout was taken.  The left lower extremity was prepped and draped in standard sterile fashion with a tourniquet around the thigh.  The extremity was elevated and the tourniquet inflated to 250 mmHg.  A longitudinal incision was made over the hallux MP joint.  Dissection was carried down through the skin and subcutaneous tissues.  The dorsal joint capsule was incised and elevated  medially and laterally.  Prominent osteophytes were removed medially dorsally and laterally from both sides of the joint.  Collateral ligaments were released.  The metatarsal head was exposed.  A concave reamer was used to remove the remaining articular cartilage and subchondral bone.  The matching convex reamer was used to remove the remaining articular cartilage and subchondral bone from the base of the proximal phalanx.  The joint was reduced and provisionally pinned after perforating both sides with a small drill bit.  Radiographs confirmed appropriate alignment of the joint.  Simulated weightbearing examination showed appropriate dorsiflexion of the hallux.  The joint was then compressed with a 4 mm partially-threaded stainless steel cannulated screw from the [employer].  A 5 hole one quarter tubular plate was then placed over the dorsum of the joint and fixed proximally and distally with 2 bicortical screws.  Radiographs again confirmed appropriate position and length of all hardware and appropriate alignment of the toe.  The wound was irrigated copiously and sprinkled with vancomycin powder.  Subcutaneous tissues were approximated with Monocryl.  Skin incisions were closed with nylon.  Attention was turned to the second MTP joint where a longitudinal incision was made.  Dissection was carried down through the subcutaneous tissues.  The extensor tendons were lengthened and the dorsal joint capsule excised.  The metatarsal head was exposed.  A Weil osteotomy was made with the oscillating saw.  The head was allowed to retract proximally and was fixed with a 12 mm [employer] FRS screw.  Same procedure was then  performed for the third metatarsal through separate incision.  Attention was turned to the second toe where a transverse incision was made over the PIP joint.  Dissection was carried down through the subcutaneous tissues and extensor mechanism.  The head of the proximal phalanx was resected  followed by the base of the middle phalanx.  The joint was reduced and fixed with a 3.  4 mm [employer] VPC screw.  It was noted to compress the joint appropriately.  The same procedure was then performed for the third toe through separate incision.  Final AP, lateral and oblique radiographs of the left foot were obtained.  These show interval correction of the second and third hammertoe deformities and arthrodesis of the hallux MP joint as well as shortening of the second and third metatarsals.  All hardware is appropriately positioned and of the appropriate lengths.  The wounds were irrigated copiously.  Extensor tendons were repaired.  Vancomycin powder was sprinkled in the incisions.  Incisions were closed with nylon.  Sterile dressings were applied followed by a well-padded cam boot.  Tourniquet was released after application of the dressings.  The patient was awakened from anesthesia and transported to the recovery room in stable condition.   FOLLOW UP PLAN: Weightbearing as tolerated on the left foot in a cam boot.  No DVT prophylaxis is indicated for this ambulatory patient.  Follow-up in the office in 2 weeks for suture removal.   RADIOGRAPHS: AP, lateral and oblique radiographs of the left foot are obtained intraoperatively.  These show interval arthrodesis of the hallux MP joint and shortening of the second and third metatarsals as well as correction of the second and third hammertoe deformities.  Hardware is appropriately positioned and of the appropriate lengths.

## 2020-08-14 NOTE — Anesthesia Preprocedure Evaluation (Signed)
Anesthesia Evaluation  Patient identified by MRN, date of birth, ID band Patient awake    Reviewed: Allergy & Precautions, NPO status , Patient's Chart, lab work & pertinent test results  Airway Mallampati: II  TM Distance: >3 FB Neck ROM: Full    Dental no notable dental hx.    Pulmonary neg pulmonary ROS,    Pulmonary exam normal breath sounds clear to auscultation       Cardiovascular negative cardio ROS Normal cardiovascular exam Rhythm:Regular Rate:Normal     Neuro/Psych negative neurological ROS  negative psych ROS   GI/Hepatic negative GI ROS, Neg liver ROS,   Endo/Other  negative endocrine ROS  Renal/GU negative Renal ROS  negative genitourinary   Musculoskeletal negative musculoskeletal ROS (+)   Abdominal   Peds negative pediatric ROS (+)  Hematology negative hematology ROS (+)   Anesthesia Other Findings   Reproductive/Obstetrics negative OB ROS                             Anesthesia Physical Anesthesia Plan  ASA: II  Anesthesia Plan: General   Post-op Pain Management:  Regional for Post-op pain   Induction: Intravenous  PONV Risk Score and Plan: 2 and Ondansetron, Dexamethasone and Treatment may vary due to age or medical condition  Airway Management Planned: LMA  Additional Equipment:   Intra-op Plan:   Post-operative Plan: Extubation in OR  Informed Consent: I have reviewed the patients History and Physical, chart, labs and discussed the procedure including the risks, benefits and alternatives for the proposed anesthesia with the patient or authorized representative who has indicated his/her understanding and acceptance.     Dental advisory given  Plan Discussed with: CRNA and Surgeon  Anesthesia Plan Comments:         Anesthesia Quick Evaluation

## 2020-08-14 NOTE — H&P (Signed)
Lee Collins is an 74 y.o. male.    Chief Complaint: left foot pain HPI: The patient is a 74 year old male without significant past medical history.  He has a long history of left forefoot pain due to hallux rigidus and second and third hammertoes with metatarsalgia.  He has failed nonoperative treatment to date including activity modification, oral anti-inflammatories and shoewear modification.  He presents today for surgical treatment of these painful and limiting left forefoot conditions.  Past Medical History:  Diagnosis Date  . BPH (benign prostatic hyperplasia)   . Gross hematuria   . History of gastroesophageal reflux (GERD)    s/p  HH repair  . History of urinary retention     Past Surgical History:  Procedure Laterality Date  . APPENDECTOMY    . LAPAROSCOPIC NISSEN FUNDOPLICATION N/A 2/68/3419   Procedure: LAPAROSCOPIC PARAESOPHAGEAL HERNIA  ;  Surgeon: Adin Hector, MD;  Location: [facility];  Service: General;  Laterality: N/A;  . LEFT ELBOW SURGERY  as child   dislocation  . RIGHT ROTATOR CUFF REPAIR Right 2011  . TONSILLECTOMY  as child  . TRANSURETHRAL RESECTION OF PROSTATE N/A 07/21/2013   Procedure: TRANSURETHRAL RESECTION OF THE PROSTATE (TURP) with clot evacuation in bladder;  Surgeon: Alexis Frock, MD;  Location: [facility];  Service: Urology;  Laterality: N/A;  . TRANSURETHRAL RESECTION OF PROSTATE N/A 09/05/2015   Procedure: TRANSURETHRAL RESECTION OF THE PROSTATE STAGED (TURP);  Surgeon: Festus Aloe, MD;  Location: [facility];  Service: Urology;  Laterality: N/A;  . VOCAL CORD SURGERY  as child   stripping of calcium deposits    History reviewed. No pertinent family history. Social History:  reports that he has never smoked. He has never used smokeless tobacco. He reports current alcohol use. He reports that he does not use drugs.  Allergies:  Allergies  Allergen Reactions  . Latex Rash  . Betadine [Povidone Iodine] Rash  . Penicillins Rash  and Other (See Comments)    Blister like areas on face    Medications Prior to Admission  Medication Sig Dispense Refill  . aspirin EC 81 MG tablet Take 1 tablet (81 mg total) by mouth daily.    Marland Kitchen ibuprofen (ADVIL,MOTRIN) 200 MG tablet Take 600 mg by mouth every 6 (six) hours as needed.    . ondansetron (ZOFRAN) 4 MG tablet Take 4 mg by mouth every 8 (eight) hours as needed for nausea or vomiting.    . promethazine (PHENERGAN) 25 MG suppository Place 25 mg rectally every 6 (six) hours as needed for nausea or vomiting.      No results found for this or any previous visit (from the past 48 hour(s)). No results found.  Review of Systems no recent fever, chills, nausea, vomiting or changes in his appetite  Blood pressure 138/77, pulse 62, temperature 98.1 F (36.7 C), temperature source Oral, resp. rate 18, height 5\' 8"  (1.727 m), weight 71.9 kg, SpO2 100 %. Physical Exam  Well-nourished well-developed man in no apparent distress.  Alert and oriented x4.  Normal mood and affect.  Gait is antalgic to the left.  Left hallux has virtually no range of motion and dorsiflexion and plantarflexion.  Skin is healthy and intact.  Pulses are palpable in the foot.  Normal sensibility to light touch dorsally and plantarly at the forefoot.  Second and third hammertoe deformities are not passively correctable.   Assessment/Plan Left hallux rigidus, metatarsalgia and second and third hammertoe deformities -to the operating room today  for left hallux MP joint arthrodesis, second and third metatarsal Weil osteotomies and hammertoe corrections.  The risks and benefits of the alternative treatment options have been discussed in detail.  The patient wishes to proceed with surgery and specifically understands risks of bleeding, infection, nerve damage, blood clots, need for additional surgery, amputation and death.   Wylene Simmer, MD 2020-08-23, 11:48 AM

## 2020-08-14 NOTE — Transfer of Care (Signed)
Immediate Anesthesia Transfer of Care Note  Patient: Lee Collins  Procedure(s) Performed: Left hallux metatarsophalangeal joint arthrodesis (Left Foot) Second and Third metatarsal Weil osteotomies and hammertoe corrections (Left Toe)  Patient Location: PACU  Anesthesia Type:GA combined with regional for post-op pain  Level of Consciousness: sedated  Airway & Oxygen Therapy: Patient Spontanous Breathing and Patient connected to face mask oxygen  Post-op Assessment: Report given to RN and Post -op Vital signs reviewed and stable  Post vital signs: Reviewed and stable  Last Vitals:  Vitals Value Taken Time  BP 123/71 08/14/20 1351  Temp    Pulse 71 08/14/20 1353  Resp 11 08/14/20 1353  SpO2 100 % 08/14/20 1353  Vitals shown include unvalidated device data.  Last Pain:  Vitals:   08/14/20 1033  TempSrc: Oral  PainSc: 0-No pain      Patients Stated Pain Goal: 1 (08/18/27 1188)  Complications: No complications documented.

## 2020-08-14 NOTE — Anesthesia Procedure Notes (Signed)
Anesthesia Regional Block: Ankle block   Pre-Anesthetic Checklist: ,, timeout performed, Correct Patient, Correct Site, Correct Laterality, Correct Procedure, Correct Position, site marked, Risks and benefits discussed,  Surgical consent,  Pre-op evaluation,  At surgeon's request and post-op pain management  Laterality: Left  Prep: chloraprep       Needles:  Injection technique: Single-shot  Needle Type: Other     Needle Length: 4cm  Needle Gauge: 25     Additional Needles:   Narrative:  Start time: 08/14/2020 11:25 AM End time: 08/14/2020 11:30 AM Injection made incrementally with aspirations every 5 mL.  Performed by: Personally  Anesthesiologist: Myrtie Soman, MD  Additional Notes: Patient tolerated the procedure well without complications

## 2020-08-14 NOTE — Discharge Instructions (Addendum)
Wylene Simmer, MD [facility]  Please read the following information regarding your care after surgery.  Medications  You only need a prescription for the narcotic pain medicine (ex. oxycodone, Percocet, Norco).  All of the other medicines listed below are available over the counter. X Aleve 2 pills twice a day for the first 3 days after surgery. X acetominophen (Tylenol) 650 mg every 4-6 hours as you need for minor to moderate pain X oxycodone as prescribed for severe pain  Narcotic pain medicine (ex. oxycodone, Percocet, Vicodin) will cause constipation.  To prevent this problem, take the following medicines while you are taking any pain medicine. X docusate sodium (Colace) 100 mg twice a day X senna (Senokot) 2 tablets twice a day  X To help prevent blood clots, take a baby aspirin (81 mg) twice a day for two weeks after surgery.  You should also get up every hour while you are awake to move around.    Weight Bearing ? Bear weight when you are able on your operated leg or foot. X Bear weight only on your operated foot in the CAM boot. ? Do not bear any weight on the operated leg or foot.  Cast / Splint / Dressing X Keep your splint, cast or dressing clean and dry.  Don't put anything (coat hanger, pencil, etc) down inside of it.  If it gets damp, use a hair dryer on the cool setting to dry it.  If it gets soaked, call the office to schedule an appointment for a cast change. ? Remove your dressing 3 days after surgery and cover the incisions with dry dressings.    After your dressing, cast or splint is removed; you may shower, but do not soak or scrub the wound.  Allow the water to run over it, and then gently pat it dry.  Swelling It is normal for you to have swelling where you had surgery.  To reduce swelling and pain, keep your toes above your nose for at least 3 days after surgery.  It may be necessary to keep your foot or leg elevated for several weeks.  If it hurts, it should be  elevated.  Follow Up Call my office at [phone] when you are discharged from the hospital or surgery center to schedule an appointment to be seen two weeks after surgery.  Call my office at [phone] if you develop a fever >101.5 F, nausea, vomiting, bleeding from the surgical site or severe pain.      Post Anesthesia Home Care Instructions  Activity: Get plenty of rest for the remainder of the day. A responsible individual must stay with you for 24 hours following the procedure.  For the next 24 hours, DO NOT: -Drive a car -Paediatric nurse -Drink alcoholic beverages -Take any medication unless instructed by your physician -Make any legal decisions or sign important papers.  Meals: Start with liquid foods such as gelatin or soup. Progress to regular foods as tolerated. Avoid greasy, spicy, heavy foods. If nausea and/or vomiting occur, drink only clear liquids until the nausea and/or vomiting subsides. Call your physician if vomiting continues.  Special Instructions/Symptoms: Your throat may feel dry or sore from the anesthesia or the breathing tube placed in your throat during surgery. If this causes discomfort, gargle with warm salt water. The discomfort should disappear within 24 hours.  If you had a scopolamine patch placed behind your ear for the management of post- operative nausea and/or vomiting:  1. The medication in the patch  is effective for 72 hours, after which it should be removed.  Wrap patch in a tissue and discard in the trash. Wash hands thoroughly with soap and water. 2. You may remove the patch earlier than 72 hours if you experience unpleasant side effects which may include dry mouth, dizziness or visual disturbances. 3. Avoid touching the patch. Wash your hands with soap and water after contact with the patch.    Regional Anesthesia Blocks  1. Numbness or the inability to move the "blocked" extremity may last from 3-48 hours after placement. The length  of time depends on the medication injected and your individual response to the medication. If the numbness is not going away after 48 hours, call your surgeon.  2. The extremity that is blocked will need to be protected until the numbness is gone and the  Strength has returned. Because you cannot feel it, you will need to take extra care to avoid injury. Because it may be weak, you may have difficulty moving it or using it. You may not know what position it is in without looking at it while the block is in effect.  3. For blocks in the legs and feet, returning to weight bearing and walking needs to be done carefully. You will need to wait until the numbness is entirely gone and the strength has returned. You should be able to move your leg and foot normally before you try and bear weight or walk. You will need someone to be with you when you first try to ensure you do not fall and possibly risk injury.  4. Bruising and tenderness at the needle site are common side effects and will resolve in a few days.  5. Persistent numbness or new problems with movement should be communicated to the surgeon or the [facility] [phone] [place] [phone]).  Call your surgeon if you experience:   1.  Fever over 101.0. 2.  Inability to urinate. 3.  Nausea and/or vomiting. 4.  Extreme swelling or bruising at the surgical site. 5.  Continued bleeding from the incision. 6.  Increased pain, redness or drainage from the incision. 7.  Problems related to your pain medication. 8.  Any problems and/or concerns

## 2020-08-15 ENCOUNTER — Encounter (HOSPITAL_BASED_OUTPATIENT_CLINIC_OR_DEPARTMENT_OTHER): Payer: Self-pay | Admitting: Orthopedic Surgery

## 2020-09-26 DIAGNOSIS — M2022 Hallux rigidus, left foot: Secondary | ICD-10-CM | POA: Diagnosis not present

## 2020-10-23 DIAGNOSIS — M2022 Hallux rigidus, left foot: Secondary | ICD-10-CM | POA: Diagnosis not present

## 2020-10-23 DIAGNOSIS — Z4889 Encounter for other specified surgical aftercare: Secondary | ICD-10-CM | POA: Diagnosis not present

## 2020-11-19 DIAGNOSIS — N400 Enlarged prostate without lower urinary tract symptoms: Secondary | ICD-10-CM | POA: Diagnosis not present

## 2020-11-19 DIAGNOSIS — R310 Gross hematuria: Secondary | ICD-10-CM | POA: Diagnosis not present

## 2020-12-18 DIAGNOSIS — R310 Gross hematuria: Secondary | ICD-10-CM | POA: Diagnosis not present

## 2020-12-18 DIAGNOSIS — N400 Enlarged prostate without lower urinary tract symptoms: Secondary | ICD-10-CM | POA: Diagnosis not present

## 2021-01-16 DIAGNOSIS — Z23 Encounter for immunization: Secondary | ICD-10-CM | POA: Diagnosis not present

## 2021-01-22 DIAGNOSIS — J309 Allergic rhinitis, unspecified: Secondary | ICD-10-CM | POA: Diagnosis not present

## 2021-01-22 DIAGNOSIS — E7800 Pure hypercholesterolemia, unspecified: Secondary | ICD-10-CM | POA: Diagnosis not present

## 2021-01-22 DIAGNOSIS — K130 Diseases of lips: Secondary | ICD-10-CM | POA: Diagnosis not present

## 2021-01-22 DIAGNOSIS — Z0000 Encounter for general adult medical examination without abnormal findings: Secondary | ICD-10-CM | POA: Diagnosis not present

## 2021-01-22 DIAGNOSIS — N400 Enlarged prostate without lower urinary tract symptoms: Secondary | ICD-10-CM | POA: Diagnosis not present

## 2021-05-15 DIAGNOSIS — U071 COVID-19: Secondary | ICD-10-CM | POA: Diagnosis not present

## 2021-05-15 DIAGNOSIS — Z20822 Contact with and (suspected) exposure to covid-19: Secondary | ICD-10-CM | POA: Diagnosis not present

## 2021-08-19 DIAGNOSIS — Z23 Encounter for immunization: Secondary | ICD-10-CM | POA: Diagnosis not present

## 2021-09-03 DIAGNOSIS — M2021 Hallux rigidus, right foot: Secondary | ICD-10-CM | POA: Diagnosis not present

## 2021-09-03 DIAGNOSIS — M79671 Pain in right foot: Secondary | ICD-10-CM | POA: Diagnosis not present

## 2021-09-03 DIAGNOSIS — M2041 Other hammer toe(s) (acquired), right foot: Secondary | ICD-10-CM | POA: Diagnosis not present

## 2021-09-29 DIAGNOSIS — G8918 Other acute postprocedural pain: Secondary | ICD-10-CM | POA: Diagnosis not present

## 2021-09-29 DIAGNOSIS — M2021 Hallux rigidus, right foot: Secondary | ICD-10-CM | POA: Diagnosis not present

## 2021-09-29 DIAGNOSIS — M21611 Bunion of right foot: Secondary | ICD-10-CM | POA: Diagnosis not present

## 2021-09-29 DIAGNOSIS — M7741 Metatarsalgia, right foot: Secondary | ICD-10-CM | POA: Diagnosis not present

## 2021-09-29 DIAGNOSIS — M2041 Other hammer toe(s) (acquired), right foot: Secondary | ICD-10-CM | POA: Diagnosis not present

## 2021-10-22 DIAGNOSIS — H6981 Other specified disorders of Eustachian tube, right ear: Secondary | ICD-10-CM | POA: Diagnosis not present

## 2021-10-22 DIAGNOSIS — H6993 Unspecified Eustachian tube disorder, bilateral: Secondary | ICD-10-CM | POA: Insufficient documentation

## 2021-10-27 DIAGNOSIS — R310 Gross hematuria: Secondary | ICD-10-CM | POA: Diagnosis not present

## 2021-11-09 DIAGNOSIS — R319 Hematuria, unspecified: Secondary | ICD-10-CM | POA: Diagnosis not present

## 2021-11-09 DIAGNOSIS — I700 Atherosclerosis of aorta: Secondary | ICD-10-CM | POA: Diagnosis not present

## 2021-11-09 DIAGNOSIS — R310 Gross hematuria: Secondary | ICD-10-CM | POA: Diagnosis not present

## 2021-11-13 DIAGNOSIS — Z4889 Encounter for other specified surgical aftercare: Secondary | ICD-10-CM | POA: Diagnosis not present

## 2021-11-13 DIAGNOSIS — M2021 Hallux rigidus, right foot: Secondary | ICD-10-CM | POA: Diagnosis not present

## 2021-11-19 DIAGNOSIS — H6981 Other specified disorders of Eustachian tube, right ear: Secondary | ICD-10-CM | POA: Diagnosis not present

## 2021-12-02 DIAGNOSIS — Z20822 Contact with and (suspected) exposure to covid-19: Secondary | ICD-10-CM | POA: Diagnosis not present

## 2021-12-14 DIAGNOSIS — M2021 Hallux rigidus, right foot: Secondary | ICD-10-CM | POA: Diagnosis not present

## 2021-12-14 DIAGNOSIS — Z4789 Encounter for other orthopedic aftercare: Secondary | ICD-10-CM | POA: Diagnosis not present

## 2021-12-17 DIAGNOSIS — H6981 Other specified disorders of Eustachian tube, right ear: Secondary | ICD-10-CM | POA: Diagnosis not present

## 2021-12-17 DIAGNOSIS — Z9622 Myringotomy tube(s) status: Secondary | ICD-10-CM | POA: Insufficient documentation

## 2021-12-17 DIAGNOSIS — H93A1 Pulsatile tinnitus, right ear: Secondary | ICD-10-CM | POA: Insufficient documentation

## 2021-12-24 DIAGNOSIS — M25674 Stiffness of right foot, not elsewhere classified: Secondary | ICD-10-CM | POA: Diagnosis not present

## 2021-12-24 DIAGNOSIS — R269 Unspecified abnormalities of gait and mobility: Secondary | ICD-10-CM | POA: Diagnosis not present

## 2022-01-01 DIAGNOSIS — M722 Plantar fascial fibromatosis: Secondary | ICD-10-CM | POA: Diagnosis not present

## 2022-01-07 DIAGNOSIS — R310 Gross hematuria: Secondary | ICD-10-CM | POA: Diagnosis not present

## 2022-01-07 DIAGNOSIS — N400 Enlarged prostate without lower urinary tract symptoms: Secondary | ICD-10-CM | POA: Diagnosis not present

## 2022-01-12 DIAGNOSIS — M79672 Pain in left foot: Secondary | ICD-10-CM | POA: Diagnosis not present

## 2022-01-26 DIAGNOSIS — M25571 Pain in right ankle and joints of right foot: Secondary | ICD-10-CM | POA: Diagnosis not present

## 2022-02-08 DIAGNOSIS — Z0000 Encounter for general adult medical examination without abnormal findings: Secondary | ICD-10-CM | POA: Diagnosis not present

## 2022-02-08 DIAGNOSIS — J309 Allergic rhinitis, unspecified: Secondary | ICD-10-CM | POA: Diagnosis not present

## 2022-02-08 DIAGNOSIS — E7800 Pure hypercholesterolemia, unspecified: Secondary | ICD-10-CM | POA: Diagnosis not present

## 2022-02-08 DIAGNOSIS — N400 Enlarged prostate without lower urinary tract symptoms: Secondary | ICD-10-CM | POA: Diagnosis not present

## 2022-02-08 DIAGNOSIS — Z20822 Contact with and (suspected) exposure to covid-19: Secondary | ICD-10-CM | POA: Diagnosis not present

## 2022-02-10 DIAGNOSIS — M79671 Pain in right foot: Secondary | ICD-10-CM | POA: Diagnosis not present

## 2022-02-12 DIAGNOSIS — Z23 Encounter for immunization: Secondary | ICD-10-CM | POA: Diagnosis not present

## 2022-04-06 DIAGNOSIS — R9431 Abnormal electrocardiogram [ECG] [EKG]: Secondary | ICD-10-CM | POA: Diagnosis not present

## 2022-04-06 DIAGNOSIS — R42 Dizziness and giddiness: Secondary | ICD-10-CM | POA: Diagnosis not present

## 2022-04-21 DIAGNOSIS — S30860A Insect bite (nonvenomous) of lower back and pelvis, initial encounter: Secondary | ICD-10-CM | POA: Diagnosis not present

## 2022-04-21 DIAGNOSIS — B351 Tinea unguium: Secondary | ICD-10-CM | POA: Diagnosis not present

## 2022-04-21 DIAGNOSIS — W57XXXA Bitten or stung by nonvenomous insect and other nonvenomous arthropods, initial encounter: Secondary | ICD-10-CM | POA: Diagnosis not present

## 2022-04-21 DIAGNOSIS — R9431 Abnormal electrocardiogram [ECG] [EKG]: Secondary | ICD-10-CM | POA: Diagnosis not present

## 2022-05-13 DIAGNOSIS — L603 Nail dystrophy: Secondary | ICD-10-CM | POA: Diagnosis not present

## 2022-05-13 DIAGNOSIS — B351 Tinea unguium: Secondary | ICD-10-CM | POA: Diagnosis not present

## 2022-07-29 DIAGNOSIS — Z23 Encounter for immunization: Secondary | ICD-10-CM | POA: Diagnosis not present

## 2022-08-19 DIAGNOSIS — Z23 Encounter for immunization: Secondary | ICD-10-CM | POA: Diagnosis not present

## 2022-10-05 DIAGNOSIS — H903 Sensorineural hearing loss, bilateral: Secondary | ICD-10-CM | POA: Diagnosis not present

## 2022-10-05 DIAGNOSIS — H6991 Unspecified Eustachian tube disorder, right ear: Secondary | ICD-10-CM | POA: Diagnosis not present

## 2022-11-18 DIAGNOSIS — H6983 Other specified disorders of Eustachian tube, bilateral: Secondary | ICD-10-CM | POA: Diagnosis not present

## 2022-11-24 ENCOUNTER — Other Ambulatory Visit: Payer: Self-pay | Admitting: Family Medicine

## 2022-11-24 DIAGNOSIS — R1032 Left lower quadrant pain: Secondary | ICD-10-CM

## 2022-12-03 ENCOUNTER — Other Ambulatory Visit: Payer: Medicare Other

## 2022-12-13 ENCOUNTER — Ambulatory Visit
Admission: RE | Admit: 2022-12-13 | Discharge: 2022-12-13 | Disposition: A | Discharge status: Home / Self Care | Payer: Medicare Other | Source: Ambulatory Visit | Attending: Family Medicine | Admitting: Family Medicine

## 2022-12-13 DIAGNOSIS — R1032 Left lower quadrant pain: Secondary | ICD-10-CM

## 2022-12-16 ENCOUNTER — Other Ambulatory Visit: Payer: Self-pay | Admitting: Family Medicine

## 2022-12-16 DIAGNOSIS — R1032 Left lower quadrant pain: Secondary | ICD-10-CM

## 2022-12-20 ENCOUNTER — Other Ambulatory Visit: Payer: Medicare Other

## 2022-12-20 ENCOUNTER — Ambulatory Visit
Admission: RE | Admit: 2022-12-20 | Discharge: 2022-12-20 | Disposition: A | Discharge status: Home / Self Care | Payer: Medicare Other | Source: Ambulatory Visit | Attending: Family Medicine | Admitting: Family Medicine

## 2022-12-20 DIAGNOSIS — R1032 Left lower quadrant pain: Secondary | ICD-10-CM

## 2022-12-20 MED ORDER — IOPAMIDOL (ISOVUE-300) INJECTION 61%
100.0000 mL | Freq: Once | INTRAVENOUS | Status: AC | PRN
  Administered 2022-12-20: 100 mL via INTRAVENOUS

## 2023-01-12 DIAGNOSIS — J343 Hypertrophy of nasal turbinates: Secondary | ICD-10-CM | POA: Diagnosis not present

## 2023-01-12 DIAGNOSIS — Z9622 Myringotomy tube(s) status: Secondary | ICD-10-CM | POA: Diagnosis not present

## 2023-01-12 DIAGNOSIS — H6993 Unspecified Eustachian tube disorder, bilateral: Secondary | ICD-10-CM | POA: Diagnosis not present

## 2023-01-12 DIAGNOSIS — M950 Acquired deformity of nose: Secondary | ICD-10-CM | POA: Diagnosis not present

## 2023-01-12 DIAGNOSIS — J34829 Nasal valve collapse, unspecified: Secondary | ICD-10-CM | POA: Insufficient documentation

## 2023-02-23 ENCOUNTER — Ambulatory Visit: Payer: Self-pay | Admitting: Surgery

## 2023-02-23 DIAGNOSIS — K429 Umbilical hernia without obstruction or gangrene: Secondary | ICD-10-CM | POA: Insufficient documentation

## 2023-02-23 DIAGNOSIS — Z87448 Personal history of other diseases of urinary system: Secondary | ICD-10-CM | POA: Insufficient documentation

## 2023-02-23 DIAGNOSIS — K4020 Bilateral inguinal hernia, without obstruction or gangrene, not specified as recurrent: Secondary | ICD-10-CM | POA: Diagnosis not present

## 2023-02-25 ENCOUNTER — Ambulatory Visit: Payer: Medicare Other | Attending: Internal Medicine | Admitting: Internal Medicine

## 2023-02-25 VITALS — BP 142/92 | HR 91 | Ht 68.0 in | Wt 163.4 lb

## 2023-02-25 DIAGNOSIS — I2584 Coronary atherosclerosis due to calcified coronary lesion: Secondary | ICD-10-CM

## 2023-02-25 DIAGNOSIS — I700 Atherosclerosis of aorta: Secondary | ICD-10-CM

## 2023-02-25 DIAGNOSIS — Z01810 Encounter for preprocedural cardiovascular examination: Secondary | ICD-10-CM | POA: Diagnosis not present

## 2023-02-25 DIAGNOSIS — I2510 Atherosclerotic heart disease of native coronary artery without angina pectoris: Secondary | ICD-10-CM | POA: Insufficient documentation

## 2023-02-25 DIAGNOSIS — E785 Hyperlipidemia, unspecified: Secondary | ICD-10-CM | POA: Insufficient documentation

## 2023-02-25 MED ORDER — METOPROLOL TARTRATE 25 MG PO TABS: ORAL_TABLET | ORAL | 0 refills | Status: DC

## 2023-02-25 NOTE — Progress Notes (Addendum)
Cardiology Office Note:    Date:  02/25/2023   ID:  Lee Collins, DOB December 22, 1945, MRN [id number]  PCP:  Johny Blamer, MD  Cardiologist:  None  Electrophysiologist:  None   Referring MD: Johny Blamer, MD   Chief Complaint/Reason for Referral: CAD  History of Present Illness:    Lee Collins is a 77 y.o. male with a history of HLD, CAC and aortic atherosclerosis who presents for cardiovascular evaluation.   Exertional LLQ abdominal pain, no chest pain.   Patient notes that he continues to walk 6 miles a day and has no chest discomfort.  He recently went to [facility] and tried to participate in a "fourteener" and was only able to complete 13.6.  His symptoms he attributed to altitude sickness as these improved when he became back to normal elevation.  We discussed his high-level activity without symptoms is extremely reassuring.  Recent abdominal CT noted to have severe three-vessel coronary artery calcifications.  We also in the room together reviewed a CT PE study from 2014 which also documented severe three-vessel coronary artery disease.  His family history includes 2 half brothers with coronary artery disease in his father who had a fatal myocardial infarction at age 70 though his father was a smoker and consumed alcohol.  Patient recently noted to have left lower quadrant hernia and he has met with the surgical team and is anticipating surgery June 5.  The patient denies chest pain, chest pressure, dyspnea at rest or with exertion, palpitations, PND, orthopnea, or leg swelling. Denies cough, fever, chills. Denies nausea, vomiting. Denies syncope or presyncope. Denies dizziness or lightheadedness. Denies snoring.    Past Medical History:  Diagnosis Date   BPH (benign prostatic hyperplasia)    Gross hematuria    History of gastroesophageal reflux (GERD)    s/p  HH repair   History of urinary retention     Past Surgical History:  Procedure Laterality Date   APPENDECTOMY      ARTHRODESIS METATARSALPHALANGEAL JOINT (MTPJ) Left 08/14/2020   Procedure: Left hallux metatarsophalangeal joint arthrodesis;  Surgeon: Toni Arthurs, MD;  Location: [facility];  Service: Orthopedics;  Laterality: Left;   HAMMERTOE RECONSTRUCTION WITH WEIL OSTEOTOMY Left 08/14/2020   Procedure: Second and Third metatarsal Weil osteotomies and hammertoe corrections;  Surgeon: Toni Arthurs, MD;  Location: [facility];  Service: Orthopedics;  Laterality: Left;   LAPAROSCOPIC NISSEN FUNDOPLICATION N/A 06/15/2013   Procedure: LAPAROSCOPIC PARAESOPHAGEAL HERNIA  ;  Surgeon: Ardeth Sportsman, MD;  Location: [facility];  Service: General;  Laterality: N/A;   LEFT ELBOW SURGERY  as child   dislocation   RIGHT ROTATOR CUFF REPAIR Right 2011   TONSILLECTOMY  as child   TRANSURETHRAL RESECTION OF PROSTATE N/A 07/21/2013   Procedure: TRANSURETHRAL RESECTION OF THE PROSTATE (TURP) with clot evacuation in bladder;  Surgeon: Sebastian Ache, MD;  Location: [facility];  Service: Urology;  Laterality: N/A;   TRANSURETHRAL RESECTION OF PROSTATE N/A 09/05/2015   Procedure: TRANSURETHRAL RESECTION OF THE PROSTATE STAGED (TURP);  Surgeon: Jerilee Field, MD;  Location: [facility];  Service: Urology;  Laterality: N/A;   VOCAL CORD SURGERY  as child   stripping of calcium deposits    Current Medications: Current Meds  Medication Sig   aspirin EC 81 MG tablet Take 1 tablet (81 mg total) by mouth daily.   metoprolol tartrate (LOPRESSOR) 25 MG tablet Take 25 mg  2 hours before Coronary CT   ondansetron (ZOFRAN) 4 MG  tablet Take 4 mg by mouth every 8 (eight) hours as needed for nausea or vomiting.   promethazine (PHENERGAN) 25 MG suppository Place 25 mg rectally every 6 (six) hours as needed for nausea or vomiting.     Allergies:   Latex, Betadine [povidone iodine], and Penicillins   Social History   Tobacco Use   Smoking status: Never   Smokeless tobacco: Never  Substance  Use Topics   Alcohol use: Yes    Comment: one beer or wine daily   Drug use: No     Family History: The patient's family history is not on file.  ROS:   Please see the history of present illness.    All other systems reviewed and are negative.  EKGs/Labs/Other Studies Reviewed:    The following studies were reviewed today:  EKG:  SR first deg AVB.  Heart rate today 91 however at primary care doctor's office pulse was 63.  Imaging studies that I have independently reviewed today: CT abdomen pelvis March 2024, CT PE 2014.  Three-vessel coronary artery calcifications.  Recent Labs: No results found for requested labs within last 365 days.  Recent Lipid Panel No results found for: "CHOL", "TRIG", "HDL", "CHOLHDL", "VLDL", "LDLCALC", "LDLDIRECT"  Physical Exam:    VS:  BP (!) 142/92   Pulse 91   Ht 5\' 8"  (1.727 m)   Wt 163 lb 6.4 oz (74.1 kg)   SpO2 98%   BMI 24.84 kg/m     Wt Readings from Last 5 Encounters:  02/25/23 163 lb 6.4 oz (74.1 kg)  08/14/20 158 lb 8.2 oz (71.9 kg)  09/05/15 155 lb (70.3 kg)  08/06/13 141 lb 6.4 oz (64.1 kg)  07/21/13 147 lb (66.7 kg)    Constitutional: No acute distress Eyes: sclera non-icteric, normal conjunctiva and lids ENMT: normal dentition, moist mucous membranes Cardiovascular: regular rhythm, normal rate, no murmur. S1 and S2 normal. No jugular venous distention.  Respiratory: clear to auscultation bilaterally GI : normal bowel sounds, soft and nontender. No distention.   MSK: extremities warm, well perfused. No edema.  NEURO: grossly nonfocal exam, moves all extremities. PSYCH: alert and oriented x 3, normal mood and affect.   ASSESSMENT:    1. Coronary artery calcification   2. Aortic atherosclerosis (HCC)   3. Hyperlipidemia, unspecified hyperlipidemia type   4. Preoperative cardiovascular examination    PLAN:    Coronary artery calcification - Plan: EKG 12-Lead, AMB Referral to [facility] Pharm-D, CT CORONARY MORPH W/CTA  COR W/SCORE W/CA W/CM &/OR WO/CM, Basic metabolic panel  Aortic atherosclerosis (HCC) - Plan: EKG 12-Lead, AMB Referral to [facility] Pharm-D, CT CORONARY MORPH W/CTA COR W/SCORE W/CA W/CM &/OR WO/CM, Basic metabolic panel  Hyperlipidemia, unspecified hyperlipidemia type - Plan: EKG 12-Lead, AMB Referral to [facility] Pharm-D, CT CORONARY MORPH W/CTA COR W/SCORE W/CA W/CM &/OR WO/CM, Basic metabolic panel  Preoperative cardiovascular examination - Plan: Basic metabolic panel  -Patient is noted to have severe three-vessel coronary artery calcifications.  In an effort to appropriately read stratify him for upcoming surgery, would recommend coronary CTA.  This is indicated in the setting of severe three-vessel coronary artery calcifications per guidelines.  We discussed implications of abnormal testing. -Heart rate at his primary care doctor's office was 63 in March when he was not feeling unwell, currently has URI symptoms and is taking stimulant medications for decongestant. -History of statin intolerance, recommend CV[facility] lipid clinic for PCSK9 inhibitor discussion.  Total time of encounter: 40 minutes total time of encounter,  including 25 minutes spent in face-to-face patient care on the date of this encounter. This time includes coordination of care and counseling regarding above mentioned problem list. Remainder of non-face-to-face time involved reviewing chart documents/testing relevant to the patient encounter and documentation in the medical record. I have independently reviewed documentation from referring provider.   Weston Brass, MD, [facility]  [facility]   Shared Decision Making/Informed Consent:       Medication Adjustments/Labs and Tests Ordered: Current medicines are reviewed at length with the patient today.  Concerns regarding medicines are outlined above.   Orders Placed This Encounter  Procedures   CT CORONARY MORPH W/CTA COR W/SCORE W/CA W/CM &/OR WO/CM   Basic  metabolic panel   AMB Referral to [facility] Pharm-D   EKG 12-Lead    Meds ordered this encounter  Medications   metoprolol tartrate (LOPRESSOR) 25 MG tablet    Sig: Take 25 mg  2 hours before Coronary CT    Dispense:  1 tablet    Refill:  0    Patient Instructions  Medication Instructions:  Continue same medications   Lab Work: Bmet today   Testing/Procedures: Coronary CT will be scheduled after approved by insurance    Follow instructions below   Follow-Up: At [facility], you and your health needs are our priority.  As part of our continuing mission to provide you with exceptional heart care, we have created designated Provider Care Teams.  These Care Teams include your primary Cardiologist (physician) and Advanced Practice Providers (APPs -  Physician Assistants and Nurse Practitioners) who all work together to provide you with the care you need, when you need it.  We recommend signing up for the patient portal called "MyChart".  Sign up information is provided on this After Visit Summary.  MyChart is used to connect with patients for Virtual Visits (Telemedicine).  Patients are able to view lab/test results, encounter notes, upcoming appointments, etc.  Non-urgent messages can be sent to your provider as well.   To learn more about what you can do with MyChart, go to [email or web address].    Your next appointment:  2 months    Provider:  Dr.Burney Calzadilla  Schedule Appointment with Pharm D in Lipid Clinic     Your cardiac CT will be scheduled at one of the below locations:   [facility] [address] [place], [place] [address] [phone]  OR  [facility] [address] [place], [place] [address] [phone]  OR   [facility] [address] [place], [place] [address] [phone]  If scheduled at [facility], please arrive at the [facility] and  Children's Entrance (Entrance C2) of [facility] 30 minutes prior to test start time. You can use the FREE valet parking offered at entrance C (encouraged to control the heart rate for the test)  Proceed to the [facility] Radiology Department (first floor) to check-in and test prep.  All radiology patients and guests should use entrance C2 at [facility], accessed from [facility], even though the hospital's physical address listed is [address].    If scheduled at [facility] or [facility], please arrive 15 mins early for check-in and test prep.   Please follow these instructions carefully (unless otherwise directed):  Hold all erectile dysfunction medications at least 3 days (72 hrs) prior to test. (Ie viagra, cialis, sildenafil, tadalafil,  etc) We will administer nitroglycerin during this exam.   On the Night Before the Test: Be sure to Drink plenty of water. Do not consume any caffeinated/decaffeinated beverages or chocolate 12 hours prior to your test. Do not take any antihistamines 12 hours prior to your test.   On the Day of the Test: Drink plenty of water until 1 hour prior to the test. Do not eat any food 1 hour prior to test. You may take your regular medications prior to the test.  Take metoprolol 25 mg  two hours prior to test.          After the Test: Drink plenty of water. After receiving IV contrast, you may experience a mild flushed feeling. This is normal. On occasion, you may experience a mild rash up to 24 hours after the test. This is not dangerous. If this occurs, you can take Benadryl 25 mg and increase your fluid intake. If you experience trouble breathing, this can be serious. If it is severe call 911 IMMEDIATELY. If it is mild, please call our office.   We will call to schedule your test 2-4 weeks out understanding that some insurance companies will need an  authorization prior to the service being performed.   For non-scheduling related questions, please contact the cardiac imaging nurse navigator should you have any questions/concerns: Rockwell Alexandria, Cardiac Imaging Nurse Navigator Larey Brick, Cardiac Imaging Nurse Navigator [facility] Direct Office Dial: [phone]   For scheduling needs, including cancellations and rescheduling, please call [place], [phone].     ADDENDUM: Pt noted to have severe mid Lad and RCA disease on CCTA.  Despite high functional capacity, did have difficulty with exercise intolerance at peak exertion at a recent mountain race.  We participated in shared decision making and determined that given the burden of disease and significantly elevated calcium score, cardiac catheterization to define coronary anatomy is warranted.  Plan for cardiac catheterization within 7 days, consent obtained as noted below.  INFORMED CONSENT: I have reviewed the risks, indications, and alternatives to cardiac catheterization, possible angioplasty, and stenting with the patient. Risks include but are not limited to bleeding, infection, vascular injury, stroke, myocardial infarction, arrhythmia, kidney injury, radiation-related injury in the case of prolonged fluoroscopy use, emergency cardiac surgery, and death. The patient understands the risks of serious complication is 1-2 in 1000 with diagnostic cardiac cath and 1-2% or less with angioplasty/stenting.

## 2023-02-25 NOTE — Patient Instructions (Addendum)
Medication Instructions:  Continue same medications   Lab Work: Bmet today   Testing/Procedures: Coronary CT will be scheduled after approved by insurance    Follow instructions below   Follow-Up: At [facility], you and your health needs are our priority.  As part of our continuing mission to provide you with exceptional heart care, we have created designated Provider Care Teams.  These Care Teams include your primary Cardiologist (physician) and Advanced Practice Providers (APPs -  Physician Assistants and Nurse Practitioners) who all work together to provide you with the care you need, when you need it.  We recommend signing up for the patient portal called "MyChart".  Sign up information is provided on this After Visit Summary.  MyChart is used to connect with patients for Virtual Visits (Telemedicine).  Patients are able to view lab/test results, encounter notes, upcoming appointments, etc.  Non-urgent messages can be sent to your provider as well.   To learn more about what you can do with MyChart, go to [email or web address].    Your next appointment:  2 months    Provider:  Dr.Acharya  Schedule Appointment with Pharm D in Lipid Clinic     Your cardiac CT will be scheduled at one of the below locations:   [facility] [address] [place], [place] [address] [phone]  OR  [facility] [address] [place], [place] [address] [phone]  OR   [facility] [address] [place], [place] [address] [phone]  If scheduled at [facility], please arrive at the [facility] and Children's Entrance (Entrance C2) of [facility] 30 minutes prior to test start time. You can use the FREE valet parking offered at entrance C (encouraged to control the heart rate for the test)  Proceed to the [facility] Radiology Department (first floor) to check-in and test  prep.  All radiology patients and guests should use entrance C2 at [facility], accessed from [facility], even though the hospital's physical address listed is [address].    If scheduled at [facility] or [facility], please arrive 15 mins early for check-in and test prep.   Please follow these instructions carefully (unless otherwise directed):  Hold all erectile dysfunction medications at least 3 days (72 hrs) prior to test. (Ie viagra, cialis, sildenafil, tadalafil, etc) We will administer nitroglycerin during this exam.   On the Night Before the Test: Be sure to Drink plenty of water. Do not consume any caffeinated/decaffeinated beverages or chocolate 12 hours prior to your test. Do not take any antihistamines 12 hours prior to your test.   On the Day of the Test: Drink plenty of water until 1 hour prior to the test. Do not eat any food 1 hour prior to test. You may take your regular medications prior to the test.  Take metoprolol 25 mg  two hours prior to test.          After the Test: Drink plenty of water. After receiving IV contrast, you may experience a mild flushed feeling. This is normal. On occasion, you may experience a mild rash up to 24 hours after the test. This is not dangerous. If this occurs, you can take Benadryl 25 mg and increase your fluid intake. If you experience trouble breathing, this can be serious. If it is severe call 911 IMMEDIATELY. If it is mild, please call our office.  We will call to schedule your test 2-4 weeks out understanding that some insurance companies will need an authorization prior to the service being performed.   For non-scheduling related questions, please contact the cardiac imaging nurse navigator should you have any questions/concerns: Rockwell Alexandria, Cardiac Imaging Nurse Navigator Larey Brick, Cardiac Imaging Nurse Navigator [place] Direct Office Dial: [phone]   For scheduling needs, including cancellations and rescheduling, please call [place], [phone].

## 2023-02-25 NOTE — H&P (View-Only) (Signed)
Cardiology Office Note:    Date:  02/25/2023   ID:  Lee Collins, DOB 11/05/1945, MRN [id number]  PCP:  Harris, William, MD  Cardiologist:  None  Electrophysiologist:  None   Referring MD: Harris, William, MD   Chief Complaint/Reason for Referral: CAD  History of Present Illness:    Lee Collins is a 76 y.o. male with a history of HLD, CAC and aortic atherosclerosis who presents for cardiovascular evaluation.   Exertional LLQ abdominal pain, no chest pain.   Patient notes that he continues to walk 6 miles a day and has no chest discomfort.  He recently went to [place] and tried to participate in a "fourteener" and was only able to complete 13.6.  His symptoms he attributed to altitude sickness as these improved when he became back to normal elevation.  We discussed his high-level activity without symptoms is extremely reassuring.  Recent abdominal CT noted to have severe three-vessel coronary artery calcifications.  We also in the room together reviewed a CT PE study from 2014 which also documented severe three-vessel coronary artery disease.  His family history includes 2 half brothers with coronary artery disease in his father who had a fatal myocardial infarction at age 75 though his father was a smoker and consumed alcohol.  Patient recently noted to have left lower quadrant hernia and he has met with the surgical team and is anticipating surgery June 5.  The patient denies chest pain, chest pressure, dyspnea at rest or with exertion, palpitations, PND, orthopnea, or leg swelling. Denies cough, fever, chills. Denies nausea, vomiting. Denies syncope or presyncope. Denies dizziness or lightheadedness. Denies snoring.    Past Medical History:  Diagnosis Date   BPH (benign prostatic hyperplasia)    Gross hematuria    History of gastroesophageal reflux (GERD)    s/p  HH repair   History of urinary retention     Past Surgical History:  Procedure Laterality Date   APPENDECTOMY      ARTHRODESIS METATARSALPHALANGEAL JOINT (MTPJ) Left 08/14/2020   Procedure: Left hallux metatarsophalangeal joint arthrodesis;  Surgeon: Hewitt, John, MD;  Location: [facility];  Service: Orthopedics;  Laterality: Left;   HAMMERTOE RECONSTRUCTION WITH WEIL OSTEOTOMY Left 08/14/2020   Procedure: Second and Third metatarsal Weil osteotomies and hammertoe corrections;  Surgeon: Hewitt, John, MD;  Location: [facility];  Service: Orthopedics;  Laterality: Left;   LAPAROSCOPIC NISSEN FUNDOPLICATION N/A 06/15/2013   Procedure: LAPAROSCOPIC PARAESOPHAGEAL HERNIA  ;  Surgeon: Steven C. Gross, MD;  Location: [facility];  Service: General;  Laterality: N/A;   LEFT ELBOW SURGERY  as child   dislocation   RIGHT ROTATOR CUFF REPAIR Right 2011   TONSILLECTOMY  as child   TRANSURETHRAL RESECTION OF PROSTATE N/A 07/21/2013   Procedure: TRANSURETHRAL RESECTION OF THE PROSTATE (TURP) with clot evacuation in bladder;  Surgeon: Theodore Manny, MD;  Location: [facility];  Service: Urology;  Laterality: N/A;   TRANSURETHRAL RESECTION OF PROSTATE N/A 09/05/2015   Procedure: TRANSURETHRAL RESECTION OF THE PROSTATE STAGED (TURP);  Surgeon: Matthew Eskridge, MD;  Location: [facility];  Service: Urology;  Laterality: N/A;   VOCAL CORD SURGERY  as child   stripping of calcium deposits    Current Medications: Current Meds  Medication Sig   aspirin EC 81 MG tablet Take 1 tablet (81 mg total) by mouth daily.   metoprolol tartrate (LOPRESSOR) 25 MG tablet Take 25 mg  2 hours before Coronary CT   ondansetron (ZOFRAN) 4 MG   tablet Take 4 mg by mouth every 8 (eight) hours as needed for nausea or vomiting.   promethazine (PHENERGAN) 25 MG suppository Place 25 mg rectally every 6 (six) hours as needed for nausea or vomiting.     Allergies:   Latex, Betadine [povidone iodine], and Penicillins   Social History   Tobacco Use   Smoking status: Never   Smokeless tobacco: Never  Substance  Use Topics   Alcohol use: Yes    Comment: one beer or wine daily   Drug use: No     Family History: The patient's family history is not on file.  ROS:   Please see the history of present illness.    All other systems reviewed and are negative.  EKGs/Labs/Other Studies Reviewed:    The following studies were reviewed today:  EKG:  SR first deg AVB.  Heart rate today 91 however at primary care doctor's office pulse was 63.  Imaging studies that I have independently reviewed today: CT abdomen pelvis March 2024, CT PE 2014.  Three-vessel coronary artery calcifications.  Recent Labs: No results found for requested labs within last 365 days.  Recent Lipid Panel No results found for: "CHOL", "TRIG", "HDL", "CHOLHDL", "VLDL", "LDLCALC", "LDLDIRECT"  Physical Exam:    VS:  BP (!) 142/92   Pulse 91   Ht 5' 8" (1.727 m)   Wt 163 lb 6.4 oz (74.1 kg)   SpO2 98%   BMI 24.84 kg/m     Wt Readings from Last 5 Encounters:  02/25/23 163 lb 6.4 oz (74.1 kg)  08/14/20 158 lb 8.2 oz (71.9 kg)  09/05/15 155 lb (70.3 kg)  08/06/13 141 lb 6.4 oz (64.1 kg)  07/21/13 147 lb (66.7 kg)    Constitutional: No acute distress Eyes: sclera non-icteric, normal conjunctiva and lids ENMT: normal dentition, moist mucous membranes Cardiovascular: regular rhythm, normal rate, no murmur. S1 and S2 normal. No jugular venous distention.  Respiratory: clear to auscultation bilaterally GI : normal bowel sounds, soft and nontender. No distention.   MSK: extremities warm, well perfused. No edema.  NEURO: grossly nonfocal exam, moves all extremities. PSYCH: alert and oriented x 3, normal mood and affect.   ASSESSMENT:    1. Coronary artery calcification   2. Aortic atherosclerosis (HCC)   3. Hyperlipidemia, unspecified hyperlipidemia type   4. Preoperative cardiovascular examination    PLAN:    Coronary artery calcification - Plan: EKG 12-Lead, AMB Referral to [facility] Pharm-D, CT CORONARY MORPH W/CTA  COR W/SCORE W/CA W/CM &/OR WO/CM, Basic metabolic panel  Aortic atherosclerosis (HCC) - Plan: EKG 12-Lead, AMB Referral to [facility] Pharm-D, CT CORONARY MORPH W/CTA COR W/SCORE W/CA W/CM &/OR WO/CM, Basic metabolic panel  Hyperlipidemia, unspecified hyperlipidemia type - Plan: EKG 12-Lead, AMB Referral to [facility] Pharm-D, CT CORONARY MORPH W/CTA COR W/SCORE W/CA W/CM &/OR WO/CM, Basic metabolic panel  Preoperative cardiovascular examination - Plan: Basic metabolic panel  -Patient is noted to have severe three-vessel coronary artery calcifications.  In an effort to appropriately read stratify him for upcoming surgery, would recommend coronary CTA.  This is indicated in the setting of severe three-vessel coronary artery calcifications per guidelines.  We discussed implications of abnormal testing. -Heart rate at his primary care doctor's office was 63 in March when he was not feeling unwell, currently has URI symptoms and is taking stimulant medications for decongestant. -History of statin intolerance, recommend CV[facility] lipid clinic for PCSK9 inhibitor discussion.  Total time of encounter: 40 minutes total time of encounter,   including 25 minutes spent in face-to-face patient care on the date of this encounter. This time includes coordination of care and counseling regarding above mentioned problem list. Remainder of non-face-to-face time involved reviewing chart documents/testing relevant to the patient encounter and documentation in the medical record. I have independently reviewed documentation from referring provider.   Lue Dubuque, MD, FACC [place]  [facility]   Shared Decision Making/Informed Consent:       Medication Adjustments/Labs and Tests Ordered: Current medicines are reviewed at length with the patient today.  Concerns regarding medicines are outlined above.   Orders Placed This Encounter  Procedures   CT CORONARY MORPH W/CTA COR W/SCORE W/CA W/CM &/OR WO/CM   Basic  metabolic panel   AMB Referral to [facility] Pharm-D   EKG 12-Lead    Meds ordered this encounter  Medications   metoprolol tartrate (LOPRESSOR) 25 MG tablet    Sig: Take 25 mg  2 hours before Coronary CT    Dispense:  1 tablet    Refill:  0    Patient Instructions  Medication Instructions:  Continue same medications   Lab Work: Bmet today   Testing/Procedures: Coronary CT will be scheduled after approved by insurance    Follow instructions below   Follow-Up: At [facility], you and your health needs are our priority.  As part of our continuing mission to provide you with exceptional heart care, we have created designated Provider Care Teams.  These Care Teams include your primary Cardiologist (physician) and Advanced Practice Providers (APPs -  Physician Assistants and Nurse Practitioners) who all work together to provide you with the care you need, when you need it.  We recommend signing up for the patient portal called "MyChart".  Sign up information is provided on this After Visit Summary.  MyChart is used to connect with patients for Virtual Visits (Telemedicine).  Patients are able to view lab/test results, encounter notes, upcoming appointments, etc.  Non-urgent messages can be sent to your provider as well.   To learn more about what you can do with MyChart, go to [email or web address].    Your next appointment:  2 months    Provider:  Dr.Autumn Gunn  Schedule Appointment with Pharm D in Lipid Clinic     Your cardiac CT will be scheduled at one of the below locations:   [facility] [address] [place], [place] [address] [phone]  OR  [facility] [address] [place], [place] [address] [phone]  OR   [facility] [address] [place], [place] [address] [phone]  If scheduled at [facility], please arrive at the Women's and  Children's Entrance (Entrance C2) of [facility] 30 minutes prior to test start time. You can use the FREE valet parking offered at entrance C (encouraged to control the heart rate for the test)  Proceed to the [facility] (first floor) to check-in and test prep.  All radiology patients and guests should use entrance C2 at [facility], accessed from [address], even though the hospital's physical address listed is [address].    If scheduled at [facility] or [facility], please arrive 15 mins early for check-in and test prep.   Please follow these instructions carefully (unless otherwise directed):  Hold all erectile dysfunction medications at least 3 days (72 hrs) prior to test. (Ie viagra, cialis, sildenafil, tadalafil,   etc) We will administer nitroglycerin during this exam.   On the Night Before the Test: Be sure to Drink plenty of water. Do not consume any caffeinated/decaffeinated beverages or chocolate 12 hours prior to your test. Do not take any antihistamines 12 hours prior to your test.   On the Day of the Test: Drink plenty of water until 1 hour prior to the test. Do not eat any food 1 hour prior to test. You may take your regular medications prior to the test.  Take metoprolol 25 mg  two hours prior to test.          After the Test: Drink plenty of water. After receiving IV contrast, you may experience a mild flushed feeling. This is normal. On occasion, you may experience a mild rash up to 24 hours after the test. This is not dangerous. If this occurs, you can take Benadryl 25 mg and increase your fluid intake. If you experience trouble breathing, this can be serious. If it is severe call 911 IMMEDIATELY. If it is mild, please call our office.   We will call to schedule your test 2-4 weeks out understanding that some insurance companies will need an  authorization prior to the service being performed.   For non-scheduling related questions, please contact the cardiac imaging nurse navigator should you have any questions/concerns: Sara Wallace, Cardiac Imaging Nurse Navigator Merle Prescott, Cardiac Imaging Nurse Navigator [facility] Direct Office Dial: [phone]   For scheduling needs, including cancellations and rescheduling, please call Brittany, [phone].     ADDENDUM: Pt noted to have severe mid Lad and RCA disease on CCTA.  Despite high functional capacity, did have difficulty with exercise intolerance at peak exertion at a recent mountain race.  We participated in shared decision making and determined that given the burden of disease and significantly elevated calcium score, cardiac catheterization to define coronary anatomy is warranted.  Plan for cardiac catheterization within 7 days, consent obtained as noted below.  INFORMED CONSENT: I have reviewed the risks, indications, and alternatives to cardiac catheterization, possible angioplasty, and stenting with the patient. Risks include but are not limited to bleeding, infection, vascular injury, stroke, myocardial infarction, arrhythmia, kidney injury, radiation-related injury in the case of prolonged fluoroscopy use, emergency cardiac surgery, and death. The patient understands the risks of serious complication is 1-2 in 1000 with diagnostic cardiac cath and 1-2% or less with angioplasty/stenting.    

## 2023-02-26 LAB — BASIC METABOLIC PANEL
BUN/Creatinine Ratio: 13 (ref 10–24)
BUN: 11 mg/dL (ref 8–27)
CO2: 25 mmol/L (ref 20–29)
Calcium: 9.2 mg/dL (ref 8.6–10.2)
Chloride: 98 mmol/L (ref 96–106)
Creatinine, Ser: 0.84 mg/dL (ref 0.76–1.27)
Glucose: 129 mg/dL — ABNORMAL HIGH (ref 70–99)
Potassium: 4.5 mmol/L (ref 3.5–5.2)
Sodium: 137 mmol/L (ref 134–144)
eGFR: 90 mL/min/{1.73_m2} (ref >59)

## 2023-03-07 ENCOUNTER — Telehealth (HOSPITAL_COMMUNITY): Payer: Self-pay | Admitting: *Deleted

## 2023-03-07 NOTE — Telephone Encounter (Signed)
Reaching out to patient to offer assistance regarding upcoming cardiac imaging study; pt verbalizes understanding of appt date/time, parking situation and where to check in, pre-test NPO status and medications ordered, and verified current allergies; name and call back number provided for further questions should they arise  Lee Kos RN Navigator Cardiac Imaging [facility] [phone] office [phone] cell  Patient to take 25mg metoprolol tartrate two hours prior to his cardiac CT scan. He is aware to arrive at 8am. 

## 2023-03-08 ENCOUNTER — Ambulatory Visit (HOSPITAL_COMMUNITY)
Admission: RE | Admit: 2023-03-08 | Discharge: 2023-03-08 | Disposition: A | Discharge status: Home / Self Care | Payer: Medicare Other | Source: Ambulatory Visit | Attending: Internal Medicine | Admitting: Internal Medicine

## 2023-03-08 DIAGNOSIS — I2510 Atherosclerotic heart disease of native coronary artery without angina pectoris: Secondary | ICD-10-CM | POA: Diagnosis not present

## 2023-03-08 DIAGNOSIS — R931 Abnormal findings on diagnostic imaging of heart and coronary circulation: Secondary | ICD-10-CM | POA: Insufficient documentation

## 2023-03-08 DIAGNOSIS — I700 Atherosclerosis of aorta: Secondary | ICD-10-CM | POA: Diagnosis not present

## 2023-03-08 DIAGNOSIS — I2584 Coronary atherosclerosis due to calcified coronary lesion: Secondary | ICD-10-CM | POA: Diagnosis not present

## 2023-03-08 DIAGNOSIS — E785 Hyperlipidemia, unspecified: Secondary | ICD-10-CM

## 2023-03-08 MED ORDER — NITROGLYCERIN 0.4 MG SL SUBL
0.8000 mg | SUBLINGUAL_TABLET | Freq: Once | SUBLINGUAL | Status: AC
  Administered 2023-03-08: 0.8 mg via SUBLINGUAL

## 2023-03-08 MED ORDER — IOHEXOL 350 MG/ML SOLN
95.0000 mL | Freq: Once | INTRAVENOUS | Status: AC | PRN
  Administered 2023-03-08: 95 mL via INTRAVENOUS

## 2023-03-08 MED ORDER — NITROGLYCERIN 0.4 MG SL SUBL
SUBLINGUAL_TABLET | SUBLINGUAL | Status: AC
  Filled 2023-03-08: qty 2

## 2023-03-09 ENCOUNTER — Other Ambulatory Visit (HOSPITAL_COMMUNITY): Payer: Self-pay | Admitting: *Deleted

## 2023-03-09 ENCOUNTER — Ambulatory Visit (HOSPITAL_BASED_OUTPATIENT_CLINIC_OR_DEPARTMENT_OTHER)
Admission: RE | Admit: 2023-03-09 | Discharge: 2023-03-09 | Disposition: A | Discharge status: Home / Self Care | Payer: Medicare Other | Source: Ambulatory Visit | Attending: Cardiology | Admitting: Cardiology

## 2023-03-09 DIAGNOSIS — E785 Hyperlipidemia, unspecified: Secondary | ICD-10-CM | POA: Diagnosis not present

## 2023-03-09 DIAGNOSIS — I2584 Coronary atherosclerosis due to calcified coronary lesion: Secondary | ICD-10-CM | POA: Diagnosis not present

## 2023-03-09 DIAGNOSIS — R931 Abnormal findings on diagnostic imaging of heart and coronary circulation: Secondary | ICD-10-CM | POA: Diagnosis not present

## 2023-03-09 DIAGNOSIS — I2510 Atherosclerotic heart disease of native coronary artery without angina pectoris: Secondary | ICD-10-CM

## 2023-03-09 DIAGNOSIS — I700 Atherosclerosis of aorta: Secondary | ICD-10-CM | POA: Diagnosis not present

## 2023-03-10 ENCOUNTER — Encounter: Payer: Self-pay | Admitting: Internal Medicine

## 2023-03-10 DIAGNOSIS — R931 Abnormal findings on diagnostic imaging of heart and coronary circulation: Secondary | ICD-10-CM

## 2023-03-10 NOTE — Telephone Encounter (Signed)
Called and spoke with patient- [facility] scheduled for 5/28- instructions sent to patients mychart by request. Lab orders placed. Patient states that he is having dental work done on Wednesday and would like to ensure this is safe following the [facility] Tuesday. Advised patient I would forward to MD for review. Patient verbalized understanding.

## 2023-03-11 DIAGNOSIS — R931 Abnormal findings on diagnostic imaging of heart and coronary circulation: Secondary | ICD-10-CM | POA: Diagnosis not present

## 2023-03-12 LAB — CBC
Hematocrit: 47.3 % (ref 37.5–51.0)
Hemoglobin: 15.7 g/dL (ref 13.0–17.7)
MCH: 32.4 pg (ref 26.6–33.0)
MCHC: 33.2 g/dL (ref 31.5–35.7)
MCV: 98 fL — ABNORMAL HIGH (ref 79–97)
Platelets: 365 10*3/uL (ref 150–450)
RBC: 4.85 x10E6/uL (ref 4.14–5.80)
RDW: 11.4 % — ABNORMAL LOW (ref 11.6–15.4)
WBC: 4.3 10*3/uL (ref 3.4–10.8)

## 2023-03-15 ENCOUNTER — Ambulatory Visit (HOSPITAL_COMMUNITY)
Admission: RE | Admit: 2023-03-15 | Discharge: 2023-03-15 | Disposition: A | Discharge status: Home / Self Care | Payer: Medicare Other | Attending: Cardiology | Admitting: Cardiology

## 2023-03-15 ENCOUNTER — Other Ambulatory Visit: Payer: Self-pay

## 2023-03-15 ENCOUNTER — Encounter (HOSPITAL_COMMUNITY)
Admission: RE | Disposition: A | Discharge status: Home / Self Care | Payer: Self-pay | Source: Home / Self Care | Attending: Cardiology

## 2023-03-15 DIAGNOSIS — I2584 Coronary atherosclerosis due to calcified coronary lesion: Secondary | ICD-10-CM | POA: Diagnosis not present

## 2023-03-15 DIAGNOSIS — I25118 Atherosclerotic heart disease of native coronary artery with other forms of angina pectoris: Secondary | ICD-10-CM | POA: Insufficient documentation

## 2023-03-15 DIAGNOSIS — E785 Hyperlipidemia, unspecified: Secondary | ICD-10-CM | POA: Insufficient documentation

## 2023-03-15 HISTORY — PX: LEFT HEART CATH AND CORONARY ANGIOGRAPHY: CATH118249

## 2023-03-15 SURGERY — LEFT HEART CATH AND CORONARY ANGIOGRAPHY
Anesthesia: LOCAL

## 2023-03-15 MED ORDER — LIDOCAINE HCL (PF) 1 % IJ SOLN
INTRAMUSCULAR | Status: DC | PRN
  Administered 2023-03-15: 2 mL

## 2023-03-15 MED ORDER — FENTANYL CITRATE (PF) 100 MCG/2ML IJ SOLN
INTRAMUSCULAR | Status: DC | PRN
  Administered 2023-03-15: 25 ug via INTRAVENOUS

## 2023-03-15 MED ORDER — SODIUM CHLORIDE 0.9 % WEIGHT BASED INFUSION: 1.0000 mL/kg/h | INTRAVENOUS | Status: DC

## 2023-03-15 MED ORDER — SODIUM CHLORIDE 0.9 % WEIGHT BASED INFUSION
3.0000 mL/kg/h | INTRAVENOUS | Status: AC
  Administered 2023-03-15: 3 mL/kg/h via INTRAVENOUS

## 2023-03-15 MED ORDER — SODIUM CHLORIDE 0.9 % IV SOLN: 250.0000 mL | INTRAVENOUS | Status: DC | PRN

## 2023-03-15 MED ORDER — SODIUM CHLORIDE 0.9% FLUSH: 3.0000 mL | INTRAVENOUS | Status: DC | PRN

## 2023-03-15 MED ORDER — MIDAZOLAM HCL 2 MG/2ML IJ SOLN
INTRAMUSCULAR | Status: AC
  Filled 2023-03-15: qty 2

## 2023-03-15 MED ORDER — SODIUM CHLORIDE 0.9% FLUSH: 3.0000 mL | Freq: Two times a day (BID) | INTRAVENOUS | Status: DC

## 2023-03-15 MED ORDER — VERAPAMIL HCL 2.5 MG/ML IV SOLN
INTRAVENOUS | Status: AC
  Filled 2023-03-15: qty 2

## 2023-03-15 MED ORDER — FENTANYL CITRATE (PF) 100 MCG/2ML IJ SOLN
INTRAMUSCULAR | Status: AC
  Filled 2023-03-15: qty 2

## 2023-03-15 MED ORDER — HEPARIN SODIUM (PORCINE) 1000 UNIT/ML IJ SOLN
INTRAMUSCULAR | Status: DC | PRN
  Administered 2023-03-15: 4000 [IU] via INTRAVENOUS

## 2023-03-15 MED ORDER — ONDANSETRON HCL 4 MG/2ML IJ SOLN: 4.0000 mg | Freq: Four times a day (QID) | INTRAMUSCULAR | Status: DC | PRN

## 2023-03-15 MED ORDER — ASPIRIN 81 MG PO TBEC: 81.0000 mg | DELAYED_RELEASE_TABLET | Freq: Every day | ORAL | 2 refills | Status: AC

## 2023-03-15 MED ORDER — ACETAMINOPHEN 325 MG PO TABS: 650.0000 mg | ORAL_TABLET | ORAL | Status: DC | PRN

## 2023-03-15 MED ORDER — HEPARIN (PORCINE) IN NACL 1000-0.9 UT/500ML-% IV SOLN
INTRAVENOUS | Status: DC | PRN
  Administered 2023-03-15 (×2): 500 mL

## 2023-03-15 MED ORDER — LIDOCAINE HCL (PF) 1 % IJ SOLN
INTRAMUSCULAR | Status: AC
  Filled 2023-03-15: qty 30

## 2023-03-15 MED ORDER — IOHEXOL 350 MG/ML SOLN
INTRAVENOUS | Status: DC | PRN
  Administered 2023-03-15: 60 mL

## 2023-03-15 MED ORDER — VERAPAMIL HCL 2.5 MG/ML IV SOLN
INTRAVENOUS | Status: DC | PRN
  Administered 2023-03-15: 10 mL via INTRA_ARTERIAL

## 2023-03-15 MED ORDER — MIDAZOLAM HCL 2 MG/2ML IJ SOLN
INTRAMUSCULAR | Status: DC | PRN
  Administered 2023-03-15: 1 mg via INTRAVENOUS

## 2023-03-15 MED ORDER — ASPIRIN 81 MG PO CHEW: 81.0000 mg | CHEWABLE_TABLET | ORAL | Status: AC

## 2023-03-15 MED ORDER — HEPARIN SODIUM (PORCINE) 1000 UNIT/ML IJ SOLN
INTRAMUSCULAR | Status: AC
  Filled 2023-03-15: qty 10

## 2023-03-15 SURGICAL SUPPLY — 11 items
BAND CMPR LRG ZPHR (HEMOSTASIS) ×1
BAND ZEPHYR COMPRESS 30 LONG (HEMOSTASIS) IMPLANT
CATH 5FR JL3.5 JR4 ANG PIG MP (CATHETERS) IMPLANT
GLIDESHEATH SLEND SS 6F .021 (SHEATH) IMPLANT
GUIDEWIRE INQWIRE 1.5J.035X260 (WIRE) IMPLANT
INQWIRE 1.5J .035X260CM (WIRE) ×1
KIT HEART LEFT (KITS) ×1 IMPLANT
PACK CARDIAC CATHETERIZATION (CUSTOM PROCEDURE TRAY) ×1 IMPLANT
SYR MEDRAD MARK 7 150ML (SYRINGE) ×1 IMPLANT
TRANSDUCER W/STOPCOCK (MISCELLANEOUS) ×1 IMPLANT
TUBING CIL FLEX 10 FLL-RA (TUBING) ×1 IMPLANT

## 2023-03-15 NOTE — Interval H&P Note (Signed)
History and Physical Interval Note:  03/15/2023 7:21 AM  Lee Collins  has presented today for surgery, with the diagnosis of abnormal cta - chest pain.  The various methods of treatment have been discussed with the patient and family. After consideration of risks, benefits and other options for treatment, the patient has consented to  Procedure(s): LEFT HEART CATH AND CORONARY ANGIOGRAPHY (N/A) as a surgical intervention.  The patient's history has been reviewed, patient examined, no change in status, stable for surgery.  I have reviewed the patient's chart and labs.  Questions were answered to the patient's satisfaction.   Cath Lab Visit (complete for each Cath Lab visit)  Clinical Evaluation Leading to the Procedure:   ACS: No.  Non-ACS:    Anginal Classification: CCS I  Anti-ischemic medical therapy: Minimal Therapy (1 class of medications)  Non-Invasive Test Results: Intermediate-risk stress test findings: cardiac mortality 1-3%/year  Prior CABG: No previous CABG        Theron Arista [facility] 03/15/2023  7:21 AM

## 2023-03-16 ENCOUNTER — Encounter (HOSPITAL_COMMUNITY): Payer: Self-pay | Admitting: Cardiology

## 2023-03-16 NOTE — Progress Notes (Addendum)
COVID Vaccine Completed:  Yes  Date of COVID positive in last 90 days:  No  PCP - Johny Blamer, MD Cardiologist - Weston Brass, MD  Chest x-ray -  EKG - 02-25-23 Epic Stress Test - N/A ECHO - N/A Cardiac Cath - 03-15-23 Epic Pacemaker/ICD device last checked: Spinal Cord Stimulator:N/A Coronary CT - 03-09-23 Epic  Bowel Prep - N/A  Sleep Study - N/A CPAP -   Fasting Blood Sugar - N/A Checks Blood Sugar _____ times a day  Last dose of GLP1 agonist-  N/A GLP1 instructions:  N/A   Last dose of SGLT-2 inhibitors-  N/A SGLT-2 instructions: N/A  Blood Thinner Instructions:  Time Aspirin Instructions:  ASA 81.  Patient to stay on per cardiology Last Dose:  Activity level:  Can go up a flight of stairs and perform activities of daily living without stopping and without symptoms of chest pain or shortness of breath.  Able to exercise without symptoms, walks and runs daily  Anesthesia review:  Abnormal Coronary CT with recent cath  Patient denies shortness of breath, fever, cough and chest pain at PAT appointment  Patient verbalized understanding of instructions that were given to them at the PAT appointment. Patient was also instructed that they will need to review over the PAT instructions again at home before surgery.

## 2023-03-16 NOTE — Patient Instructions (Addendum)
SURGICAL WAITING ROOM VISITATION Patients having surgery or a procedure may have no more than 2 support people in the waiting area - these visitors may rotate.    Children under the age of 86 must have an adult with them who is not the patient.  If the patient needs to stay at the hospital during part of their recovery, the visitor guidelines for inpatient rooms apply. Pre-op nurse will coordinate an appropriate time for 1 support person to accompany patient in pre-op.  This support person may not rotate.    Please refer to the [facility] website for the visitor guidelines for Inpatients (after your surgery is over and you are in a regular room).       Your procedure is scheduled on: 03-23-23   Report to [facility] Main Entrance    Report to admitting at 12:15 PM   Call this number if you have problems the morning of surgery [phone]   Do not eat food :After Midnight.   After Midnight you may have the following liquids until 11:30 AM DAY OF SURGERY  Water Non-Citrus Juices (without pulp, NO RED-Apple, White grape, White cranberry) Black Coffee (NO MILK/CREAM OR CREAMERS, sugar ok)  Clear Tea (NO MILK/CREAM OR CREAMERS, sugar ok) regular and decaf                             Plain Jell-O (NO RED)                                           Fruit ices (not with fruit pulp, NO RED)                                     Popsicles (NO RED)                                                               Sports drinks like Gatorade (NO RED)                   If you have questions, please contact your surgeon's office.   FOLLOW  ANY ADDITIONAL PRE OP INSTRUCTIONS YOU RECEIVED FROM YOUR SURGEON'S OFFICE!!!     Oral Hygiene is also important to reduce your risk of infection.                                    Remember - BRUSH YOUR TEETH THE MORNING OF SURGERY WITH YOUR REGULAR TOOTHPASTE   Do NOT smoke after Midnight   Take these medicines the morning of surgery with A SIP OF  WATER:   Claritin  Zofran/Promethazine if needed  Okay to use nasal spray                              You may not have any metal on your body including jewelry, and body piercing   Do not wear lotions, powders, cologne, or  deodorant              Men may shave face and neck.   Do not bring valuables to the hospital. [facility] IS NOT RESPONSIBLE   FOR VALUABLES.   Contacts, dentures or bridgework may not be worn into surgery.  DO NOT BRING YOUR HOME MEDICATIONS TO THE HOSPITAL. PHARMACY WILL DISPENSE MEDICATIONS LISTED ON YOUR MEDICATION LIST TO YOU DURING YOUR ADMISSION IN THE HOSPITAL!    Patients discharged on the day of surgery will not be allowed to drive home.  Someone NEEDS to stay with you for the first 24 hours after anesthesia.               Please read over the following fact sheets you were given: IF YOU HAVE QUESTIONS ABOUT YOUR PRE-OP INSTRUCTIONS PLEASE CALL [phone] Gwen  If you received a COVID test during your pre-op visit  it is requested that you wear a mask when out in public, stay away from anyone that may not be feeling well and notify your surgeon if you develop symptoms. If you test positive for Covid or have been in contact with anyone that has tested positive in the last 10 days please notify you surgeon.  [facility] - Preparing for Surgery Before surgery, you can play an important role.  Because skin is not sterile, your skin needs to be as free of germs as possible.  You can reduce the number of germs on your skin by washing with CHG (chlorahexidine gluconate) soap before surgery.  CHG is an antiseptic cleaner which kills germs and bonds with the skin to continue killing germs even after washing. Please DO NOT use if you have an allergy to CHG or antibacterial soaps.  If your skin becomes reddened/irritated stop using the CHG and inform your nurse when you arrive at Short Stay. Do not shave (including legs and underarms) for at least 48 hours prior to  the first CHG shower.  You may shave your face/neck.  Please follow these instructions carefully:  1.  Shower with CHG Soap the night before surgery and the  morning of surgery.  2.  If you choose to wash your hair, wash your hair first as usual with your normal  shampoo.  3.  After you shampoo, rinse your hair and body thoroughly to remove the shampoo.                             4.  Use CHG as you would any other liquid soap.  You can apply chg directly to the skin and wash.  Gently with a scrungie or clean washcloth.  5.  Apply the CHG Soap to your body ONLY FROM THE NECK DOWN.   Do   not use on face/ open                           Wound or open sores. Avoid contact with eyes, ears mouth and   genitals (private parts).                       Wash face,  Genitals (private parts) with your normal soap.             6.  Wash thoroughly, paying special attention to the area where your    surgery  will be performed.  7.  Thoroughly rinse your body  with warm water from the neck down.  8.  DO NOT shower/wash with your normal soap after using and rinsing off the CHG Soap.                9.  Pat yourself dry with a clean towel.            10.  Wear clean pajamas.            11.  Place clean sheets on your bed the night of your first shower and do not  sleep with pets. Day of Surgery : Do not apply any lotions/deodorants the morning of surgery.  Please wear clean clothes to the hospital/surgery center.  FAILURE TO FOLLOW THESE INSTRUCTIONS MAY RESULT IN THE CANCELLATION OF YOUR SURGERY  PATIENT SIGNATURE_________________________________  NURSE SIGNATURE__________________________________  ________________________________________________________________________

## 2023-03-18 ENCOUNTER — Other Ambulatory Visit: Payer: Self-pay

## 2023-03-18 ENCOUNTER — Encounter (HOSPITAL_COMMUNITY): Payer: Self-pay

## 2023-03-18 ENCOUNTER — Encounter (HOSPITAL_COMMUNITY)
Admission: RE | Admit: 2023-03-18 | Discharge: 2023-03-18 | Disposition: A | Discharge status: Home / Self Care | Payer: Medicare Other | Source: Ambulatory Visit | Attending: Surgery | Admitting: Surgery

## 2023-03-18 VITALS — BP 136/79 | HR 61 | Temp 97.9°F | Resp 12 | Ht 68.0 in | Wt 160.6 lb

## 2023-03-18 DIAGNOSIS — Z01812 Encounter for preprocedural laboratory examination: Secondary | ICD-10-CM | POA: Diagnosis not present

## 2023-03-18 DIAGNOSIS — R339 Retention of urine, unspecified: Secondary | ICD-10-CM | POA: Diagnosis not present

## 2023-03-18 DIAGNOSIS — I2510 Atherosclerotic heart disease of native coronary artery without angina pectoris: Secondary | ICD-10-CM | POA: Insufficient documentation

## 2023-03-18 DIAGNOSIS — N400 Enlarged prostate without lower urinary tract symptoms: Secondary | ICD-10-CM | POA: Insufficient documentation

## 2023-03-18 DIAGNOSIS — Z01818 Encounter for other preprocedural examination: Secondary | ICD-10-CM

## 2023-03-18 DIAGNOSIS — K429 Umbilical hernia without obstruction or gangrene: Secondary | ICD-10-CM | POA: Insufficient documentation

## 2023-03-18 DIAGNOSIS — K219 Gastro-esophageal reflux disease without esophagitis: Secondary | ICD-10-CM | POA: Diagnosis not present

## 2023-03-18 HISTORY — DX: Gastro-esophageal reflux disease without esophagitis: K21.9

## 2023-03-18 HISTORY — DX: Other complications of anesthesia, initial encounter: T88.59XA

## 2023-03-18 LAB — BASIC METABOLIC PANEL
Anion gap: 8 (ref 5–15)
BUN: 14 mg/dL (ref 8–23)
CO2: 27 mmol/L (ref 22–32)
Calcium: 9 mg/dL (ref 8.9–10.3)
Chloride: 99 mmol/L (ref 98–111)
Creatinine, Ser: 0.84 mg/dL (ref 0.61–1.24)
GFR, Estimated: >60 mL/min (ref >60)
Glucose, Bld: 79 mg/dL (ref 70–99)
Potassium: 4.4 mmol/L (ref 3.5–5.1)
Sodium: 134 mmol/L — ABNORMAL LOW (ref 135–145)

## 2023-03-18 LAB — CBC
HCT: 45.5 % (ref 39.0–52.0)
Hemoglobin: 15.8 g/dL (ref 13.0–17.0)
MCH: 33.4 pg (ref 26.0–34.0)
MCHC: 34.7 g/dL (ref 30.0–36.0)
MCV: 96.2 fL (ref 80.0–100.0)
Platelets: 319 10*3/uL (ref 150–400)
RBC: 4.73 MIL/uL (ref 4.22–5.81)
RDW: 11.5 % (ref 11.5–15.5)
WBC: 4.6 10*3/uL (ref 4.0–10.5)
nRBC: 0 % (ref 0.0–0.2)

## 2023-03-21 ENCOUNTER — Telehealth: Payer: Self-pay | Admitting: Internal Medicine

## 2023-03-21 ENCOUNTER — Encounter (HOSPITAL_COMMUNITY): Payer: Self-pay

## 2023-03-21 NOTE — Anesthesia Preprocedure Evaluation (Signed)
Anesthesia Evaluation  Patient identified by MRN, date of birth, ID band Patient awake    Reviewed: Allergy & Precautions, NPO status , Patient's Chart, lab work & pertinent test results  Airway Mallampati: I  TM Distance: >3 FB Neck ROM: Full    Dental no notable dental hx. (+) Teeth Intact, Dental Advisory Given   Pulmonary neg pulmonary ROS   Pulmonary exam normal breath sounds clear to auscultation       Cardiovascular + CAD  Normal cardiovascular exam Rhythm:Regular Rate:Normal  Cath 2024   Prox RCA-1 lesion is 90% stenosed.   Mid RCA lesion is 80% stenosed.   Prox RCA-2 lesion is 90% stenosed.   Mid RCA to Dist RCA lesion is 55% stenosed.   Prox LAD lesion is 60% stenosed.   Mid LAD lesion is 40% stenosed.   1st Diag lesion is 75% stenosed.   Mid Cx to Dist Cx lesion is 70% stenosed.   The left ventricular systolic function is normal.   LV end diastolic pressure is normal.   The left ventricular ejection fraction is 55-65% by visual estimate.   1. Obstructive CAD. The most severe disease is in the RCA which is diffusely diseased. There is moderate obstructive disease in a small caliber diagonal and in the distal LCx. The proximal LAD has borderline stenosis. All vessels are severely calcified. 2. Normal LV function 3. Normal LVEDP   Plan: recommend aggressive medical therapy and risk factor modification. In absence of symptoms there is no indication for intervention. I think patient could proceed with planned inguinal hernia surgery and colonoscopy.     Neuro/Psych negative neurological ROS  negative psych ROS   GI/Hepatic Neg liver ROS, hiatal hernia,GERD  ,,  Endo/Other  negative endocrine ROS    Renal/GU negative Renal ROS  negative genitourinary   Musculoskeletal negative musculoskeletal ROS (+)    Abdominal   Peds  Hematology negative hematology ROS (+)   Anesthesia Other Findings    Reproductive/Obstetrics                             Anesthesia Physical Anesthesia Plan  ASA: 3  Anesthesia Plan: General   Post-op Pain Management: Tylenol PO (pre-op)*   Induction: Intravenous  PONV Risk Score and Plan: Midazolam, Dexamethasone and Ondansetron  Airway Management Planned: Oral ETT  Additional Equipment:   Intra-op Plan:   Post-operative Plan: Extubation in OR  Informed Consent: I have reviewed the patients History and Physical, chart, labs and discussed the procedure including the risks, benefits and alternatives for the proposed anesthesia with the patient or authorized representative who has indicated his/her understanding and acceptance.     Dental advisory given  Plan Discussed with: CRNA  Anesthesia Plan Comments: (See PAT note from 5/31 by Sherlie Ban PA-C )        Anesthesia Quick Evaluation

## 2023-03-21 NOTE — Progress Notes (Signed)
Case: [id number] Date/Time: 03/23/23 1415   Procedures:      LAPAROSCOPIC LEFT AND POSSIBLE RIGHT INGUINAL HERNIA REPAIR WITH MESH (Bilateral)     SUTURE PRIMARY REPAIR OF UMBILICAL HERNIA   Anesthesia type: General   Pre-op diagnosis:      LEFT AND POSSIBLE RIGHT INGUINAL HERNIAS     UMBILICAL HERNIA   Location: [facility] ROOM 09 / [facility]   Surgeons: Karie Soda, MD       DISCUSSION: Lee Collins is a 77 year old male who presents to PAT prior to inguinal hernia repair with Dr. Michaell Cowing.  Past medical history significant for GERD, status post hiatal hernia repair, BPH with history of urinary retention s/p TURP, coronary calcifications.  Prior complication of anesthesia includes waking up "violent".  Patient recently saw cardiology on 02/25/2023 and a CT coronary was recommended which showed severe calcifications.  A left heart cath was then scheduled and completed on 03/15/2023 which showed obstructive coronary artery disease however no interventions were completed due to patient being asymptomatic.  Patient reportedly has a good functional status.  He was cleared by cardiology for surgery"  "Plan: recommend aggressive medical therapy and risk factor modification. In absence of symptoms there is no indication for intervention. I think patient could proceed with planned inguinal hernia surgery and colonoscopy."  Patient denies CP, SOB, fever, or cough at PAT visit.  Anticipate patient can proceed barring any acute change.  VS: BP 136/79   Pulse 61   Temp 36.6 C (Oral)   Resp 12   Ht 5\' 8"  (1.727 m)   Wt 72.8 kg   SpO2 100%   BMI 24.42 kg/m   PROVIDERS: Johny Blamer, MD Cardiology:Gayatri Jacques Navy, MD  LABS: Labs reviewed: Acceptable for surgery. (all labs ordered are listed, but only abnormal results are displayed)  Labs Reviewed  BASIC METABOLIC PANEL - Abnormal; Notable for the following components:      Result Value   Sodium 134 (*)    All other components within normal  limits  CBC     IMAGES:  CT Coronary 03/08/23:  IMPRESSION: 1. Coronary artery calcium score 3934 Agatston units. This places the patient in the 99th percentile for age and gender, suggesting high risk for future cardiac events.   2. Borderline hemodynamically significant stenosis proximal-mid RCA, extensive plaque.   3.  Suspect hemodynamically significant stenosis in the mid LAD.   4. Nonobstructive disease in the LCx. There is a small-moderate PLOM with diffuse disease, could have severe stenosis but unable to do FFR of this vessel.   Consider cardiac catheterization.   CT Abdomen/Pelvis 12/20/22:  IMPRESSION: 1. No acute intra-abdominal or pelvic pathology. 2. Moderate colonic stool burden. No bowel obstruction. 3. Enlarged prostate gland with postsurgical changes of TURP. 4.  Aortic Atherosclerosis (ICD10-I70.0).       EKG 02/25/23:  Sinus rhythm with 1st degree AV block   CV:  Left Heart Cath 03/15/23:    Prox RCA-1 lesion is 90% stenosed.   Mid RCA lesion is 80% stenosed.   Prox RCA-2 lesion is 90% stenosed.   Mid RCA to Dist RCA lesion is 55% stenosed.   Prox LAD lesion is 60% stenosed.   Mid LAD lesion is 40% stenosed.   1st Diag lesion is 75% stenosed.   Mid Cx to Dist Cx lesion is 70% stenosed.   The left ventricular systolic function is normal.   LV end diastolic pressure is normal.   The left ventricular ejection fraction is 55-65% by  visual estimate.   Obstructive CAD. The most severe disease is in the RCA which is diffusely diseased. There is moderate obstructive disease in a small caliber diagonal and in the distal LCx. The proximal LAD has borderline stenosis. All vessels are severely calcified. Normal LV function Normal LVEDP   Plan: recommend aggressive medical therapy and risk factor modification. In absence of symptoms there is no indication for intervention. I think patient could proceed with planned inguinal hernia surgery and  colonoscopy.   Past Medical History:  Diagnosis Date   BPH (benign prostatic hyperplasia)    Complication of anesthesia    Woke up "violent" one time   GERD (gastroesophageal reflux disease)    Gross hematuria    History of gastroesophageal reflux (GERD)    s/p  HH repair   History of urinary retention     Past Surgical History:  Procedure Laterality Date   APPENDECTOMY     ARTHRODESIS METATARSALPHALANGEAL JOINT (MTPJ) Left 08/14/2020   Procedure: Left hallux metatarsophalangeal joint arthrodesis;  Surgeon: Toni Arthurs, MD;  Location: [facility];  Service: Orthopedics;  Laterality: Left;   HAMMERTOE RECONSTRUCTION WITH WEIL OSTEOTOMY Left 08/14/2020   Procedure: Second and Third metatarsal Weil osteotomies and hammertoe corrections;  Surgeon: Toni Arthurs, MD;  Location: [facility];  Service: Orthopedics;  Laterality: Left;   LAPAROSCOPIC NISSEN FUNDOPLICATION N/A 06/15/2013   Procedure: LAPAROSCOPIC PARAESOPHAGEAL HERNIA  ;  Surgeon: Ardeth Sportsman, MD;  Location: [facility];  Service: General;  Laterality: N/A;   LEFT ELBOW SURGERY  as child   dislocation   LEFT HEART CATH AND CORONARY ANGIOGRAPHY N/A 03/15/2023   Procedure: LEFT HEART CATH AND CORONARY ANGIOGRAPHY;  Surgeon: Swazil[place], Peter M, MD;  Location: [facility] INVASIVE CV LAB;  Service: Cardiovascular;  Laterality: N/A;   RIGHT ROTATOR CUFF REPAIR Right 2011   TONSILLECTOMY  as child   TRANSURETHRAL RESECTION OF PROSTATE N/A 07/21/2013   Procedure: TRANSURETHRAL RESECTION OF THE PROSTATE (TURP) with clot evacuation in bladder;  Surgeon: Sebastian Ache, MD;  Location: [facility];  Service: Urology;  Laterality: N/A;   TRANSURETHRAL RESECTION OF PROSTATE N/A 09/05/2015   Procedure: TRANSURETHRAL RESECTION OF THE PROSTATE STAGED (TURP);  Surgeon: Jerilee Field, MD;  Location: [facility];  Service: Urology;  Laterality: N/A;   VOCAL CORD SURGERY  as child   stripping of calcium deposits     MEDICATIONS:  aspirin EC 81 MG tablet   fluticasone (FLONASE) 50 MCG/ACT nasal spray   ibuprofen (ADVIL) 200 MG tablet   loratadine (CLARITIN) 10 MG tablet   ondansetron (ZOFRAN) 4 MG tablet   promethazine (PHENERGAN) 25 MG suppository   No current facility-administered medications for this encounter.   Marcille Blanco [facility] Short Stay/Anesthesiology [facility] Phone [phone] 03/21/2023 8:42 AM

## 2023-03-21 NOTE — Telephone Encounter (Signed)
Informed patient that from provider last result note he did not need to repeat labs.

## 2023-03-21 NOTE — Telephone Encounter (Signed)
Patient states [facility] e-mailed him and told him that he needs to have labs drawn, but he just had labs last week. Please confirm whether or not patient needs repeat labs.

## 2023-03-23 ENCOUNTER — Ambulatory Visit (HOSPITAL_COMMUNITY)
Admission: RE | Admit: 2023-03-23 | Discharge: 2023-03-23 | Disposition: A | Discharge status: Home / Self Care | Payer: Medicare Other | Source: Ambulatory Visit | Attending: Surgery | Admitting: Surgery

## 2023-03-23 ENCOUNTER — Ambulatory Visit (HOSPITAL_COMMUNITY): Payer: Medicare Other | Admitting: Medical

## 2023-03-23 ENCOUNTER — Ambulatory Visit (HOSPITAL_BASED_OUTPATIENT_CLINIC_OR_DEPARTMENT_OTHER): Payer: Medicare Other | Admitting: Certified Registered Nurse Anesthetist

## 2023-03-23 ENCOUNTER — Encounter (HOSPITAL_COMMUNITY): Payer: Self-pay | Admitting: Surgery

## 2023-03-23 ENCOUNTER — Other Ambulatory Visit: Payer: Self-pay

## 2023-03-23 ENCOUNTER — Encounter (HOSPITAL_COMMUNITY)
Admission: RE | Disposition: A | Discharge status: Home / Self Care | Payer: Self-pay | Source: Ambulatory Visit | Attending: Surgery

## 2023-03-23 DIAGNOSIS — I2510 Atherosclerotic heart disease of native coronary artery without angina pectoris: Secondary | ICD-10-CM | POA: Diagnosis not present

## 2023-03-23 DIAGNOSIS — K449 Diaphragmatic hernia without obstruction or gangrene: Secondary | ICD-10-CM | POA: Diagnosis not present

## 2023-03-23 DIAGNOSIS — K4020 Bilateral inguinal hernia, without obstruction or gangrene, not specified as recurrent: Secondary | ICD-10-CM | POA: Diagnosis not present

## 2023-03-23 DIAGNOSIS — Z01818 Encounter for other preprocedural examination: Secondary | ICD-10-CM

## 2023-03-23 DIAGNOSIS — K4090 Unilateral inguinal hernia, without obstruction or gangrene, not specified as recurrent: Secondary | ICD-10-CM | POA: Diagnosis not present

## 2023-03-23 DIAGNOSIS — K219 Gastro-esophageal reflux disease without esophagitis: Secondary | ICD-10-CM | POA: Diagnosis not present

## 2023-03-23 DIAGNOSIS — K429 Umbilical hernia without obstruction or gangrene: Secondary | ICD-10-CM | POA: Insufficient documentation

## 2023-03-23 DIAGNOSIS — D176 Benign lipomatous neoplasm of spermatic cord: Secondary | ICD-10-CM | POA: Insufficient documentation

## 2023-03-23 HISTORY — PX: INGUINAL HERNIA REPAIR: SHX194

## 2023-03-23 HISTORY — PX: UMBILICAL HERNIA REPAIR: SHX196

## 2023-03-23 SURGERY — REPAIR, HERNIA, INGUINAL, BILATERAL, LAPAROSCOPIC
Anesthesia: General

## 2023-03-23 MED ORDER — ACETAMINOPHEN 10 MG/ML IV SOLN
INTRAVENOUS | Status: AC
  Administered 2023-03-23: 1000 mg via INTRAVENOUS
  Filled 2023-03-23: qty 100

## 2023-03-23 MED ORDER — FENTANYL CITRATE PF 50 MCG/ML IJ SOSY: 25.0000 ug | PREFILLED_SYRINGE | INTRAMUSCULAR | Status: DC | PRN

## 2023-03-23 MED ORDER — BUPIVACAINE HCL 0.25 % IJ SOLN
INTRAMUSCULAR | Status: AC
  Filled 2023-03-23: qty 1

## 2023-03-23 MED ORDER — ORAL CARE MOUTH RINSE: 15.0000 mL | Freq: Once | OROMUCOSAL | Status: DC

## 2023-03-23 MED ORDER — CHLORHEXIDINE GLUCONATE CLOTH 2 % EX PADS: 6.0000 | MEDICATED_PAD | Freq: Once | CUTANEOUS | Status: DC

## 2023-03-23 MED ORDER — FENTANYL CITRATE (PF) 250 MCG/5ML IJ SOLN
INTRAMUSCULAR | Status: DC | PRN
  Administered 2023-03-23 (×2): 50 ug via INTRAVENOUS
  Administered 2023-03-23: 100 ug via INTRAVENOUS

## 2023-03-23 MED ORDER — ACETAMINOPHEN 500 MG PO TABS
1000.0000 mg | ORAL_TABLET | ORAL | Status: AC
  Administered 2023-03-23: 1000 mg via ORAL
  Filled 2023-03-23: qty 2

## 2023-03-23 MED ORDER — ACETAMINOPHEN 10 MG/ML IV SOLN: 1000.0000 mg | Freq: Once | INTRAVENOUS | Status: AC

## 2023-03-23 MED ORDER — GABAPENTIN 300 MG PO CAPS
300.0000 mg | ORAL_CAPSULE | ORAL | Status: AC
  Administered 2023-03-23: 300 mg via ORAL
  Filled 2023-03-23: qty 1

## 2023-03-23 MED ORDER — FENTANYL CITRATE (PF) 100 MCG/2ML IJ SOLN
INTRAMUSCULAR | Status: AC
  Filled 2023-03-23: qty 2

## 2023-03-23 MED ORDER — PHENYLEPHRINE HCL (PRESSORS) 10 MG/ML IV SOLN
INTRAVENOUS | Status: AC
  Filled 2023-03-23: qty 1

## 2023-03-23 MED ORDER — ONDANSETRON HCL 4 MG/2ML IJ SOLN
INTRAMUSCULAR | Status: DC | PRN
  Administered 2023-03-23: 4 mg via INTRAVENOUS

## 2023-03-23 MED ORDER — KETAMINE HCL 10 MG/ML IJ SOLN
INTRAMUSCULAR | Status: DC | PRN
  Administered 2023-03-23: 30 mg via INTRAVENOUS

## 2023-03-23 MED ORDER — SODIUM CHLORIDE 0.9% FLUSH: 3.0000 mL | Freq: Two times a day (BID) | INTRAVENOUS | Status: DC

## 2023-03-23 MED ORDER — SODIUM CHLORIDE 0.9 % IV SOLN: 250.0000 mL | INTRAVENOUS | Status: DC | PRN

## 2023-03-23 MED ORDER — BUPIVACAINE LIPOSOME 1.3 % IJ SUSP
INTRAMUSCULAR | Status: AC
  Filled 2023-03-23: qty 20

## 2023-03-23 MED ORDER — CLINDAMYCIN PHOSPHATE 900 MG/50ML IV SOLN
900.0000 mg | INTRAVENOUS | Status: DC
  Filled 2023-03-23: qty 50

## 2023-03-23 MED ORDER — BUPIVACAINE LIPOSOME 1.3 % IJ SUSP: 20.0000 mL | Freq: Once | INTRAMUSCULAR | Status: DC

## 2023-03-23 MED ORDER — LIDOCAINE HCL 2 % IJ SOLN
INTRAMUSCULAR | Status: AC
  Filled 2023-03-23: qty 20

## 2023-03-23 MED ORDER — PROPOFOL 10 MG/ML IV BOLUS
INTRAVENOUS | Status: DC | PRN
  Administered 2023-03-23: 130 mg via INTRAVENOUS
  Administered 2023-03-23: 20 mg via INTRAVENOUS

## 2023-03-23 MED ORDER — PROPOFOL 10 MG/ML IV BOLUS
INTRAVENOUS | Status: AC
  Filled 2023-03-23: qty 20

## 2023-03-23 MED ORDER — PHENYLEPHRINE HCL-NACL 20-0.9 MG/250ML-% IV SOLN
INTRAVENOUS | Status: DC | PRN
  Administered 2023-03-23: 25 ug/min via INTRAVENOUS

## 2023-03-23 MED ORDER — OXYCODONE HCL 5 MG PO TABS
5.0000 mg | ORAL_TABLET | Freq: Once | ORAL | Status: AC | PRN
  Administered 2023-03-23: 5 mg via ORAL

## 2023-03-23 MED ORDER — DEXAMETHASONE SODIUM PHOSPHATE 4 MG/ML IJ SOLN
4.0000 mg | INTRAMUSCULAR | Status: AC
  Administered 2023-03-23: 4 mg via INTRAVENOUS

## 2023-03-23 MED ORDER — TRAMADOL HCL 50 MG PO TABS: 50.0000 mg | ORAL_TABLET | Freq: Four times a day (QID) | ORAL | 0 refills | Status: DC | PRN

## 2023-03-23 MED ORDER — ROCURONIUM BROMIDE 10 MG/ML (PF) SYRINGE
PREFILLED_SYRINGE | INTRAVENOUS | Status: DC | PRN
  Administered 2023-03-23: 30 mg via INTRAVENOUS
  Administered 2023-03-23: 20 mg via INTRAVENOUS
  Administered 2023-03-23: 50 mg via INTRAVENOUS

## 2023-03-23 MED ORDER — BUPIVACAINE LIPOSOME 1.3 % IJ SUSP
INTRAMUSCULAR | Status: DC | PRN
  Administered 2023-03-23: 70 mL

## 2023-03-23 MED ORDER — SODIUM CHLORIDE 0.9% FLUSH: 3.0000 mL | INTRAVENOUS | Status: DC | PRN

## 2023-03-23 MED ORDER — KETAMINE HCL 50 MG/5ML IJ SOSY
PREFILLED_SYRINGE | INTRAMUSCULAR | Status: AC
  Filled 2023-03-23: qty 5

## 2023-03-23 MED ORDER — 0.9 % SODIUM CHLORIDE (POUR BTL) OPTIME
TOPICAL | Status: DC | PRN
  Administered 2023-03-23: 1000 mL

## 2023-03-23 MED ORDER — ONDANSETRON HCL 4 MG/2ML IJ SOLN
INTRAMUSCULAR | Status: AC
  Filled 2023-03-23: qty 2

## 2023-03-23 MED ORDER — LIDOCAINE 20MG/ML (2%) 15 ML SYRINGE OPTIME
INTRAMUSCULAR | Status: DC | PRN
  Administered 2023-03-23: 1.5 mg/kg/h via INTRAVENOUS

## 2023-03-23 MED ORDER — STERILE WATER FOR IRRIGATION IR SOLN
Status: DC | PRN
  Administered 2023-03-23: 1000 mL

## 2023-03-23 MED ORDER — OXYCODONE HCL 5 MG PO TABS
ORAL_TABLET | ORAL | Status: AC
  Filled 2023-03-23: qty 1

## 2023-03-23 MED ORDER — ROCURONIUM BROMIDE 10 MG/ML (PF) SYRINGE
PREFILLED_SYRINGE | INTRAVENOUS | Status: AC
  Filled 2023-03-23: qty 10

## 2023-03-23 MED ORDER — AMISULPRIDE (ANTIEMETIC) 5 MG/2ML IV SOLN
INTRAVENOUS | Status: AC
  Administered 2023-03-23: 10 mg
  Filled 2023-03-23: qty 4

## 2023-03-23 MED ORDER — GENTAMICIN SULFATE 40 MG/ML IJ SOLN
5.0000 mg/kg | INTRAVENOUS | Status: DC
  Filled 2023-03-23: qty 9

## 2023-03-23 MED ORDER — LIDOCAINE 2% (20 MG/ML) 5 ML SYRINGE
INTRAMUSCULAR | Status: DC | PRN
  Administered 2023-03-23: 60 mg via INTRAVENOUS

## 2023-03-23 MED ORDER — SUGAMMADEX SODIUM 200 MG/2ML IV SOLN
INTRAVENOUS | Status: DC | PRN
  Administered 2023-03-23: 200 mg via INTRAVENOUS

## 2023-03-23 MED ORDER — GENTAMICIN SULFATE 40 MG/ML IJ SOLN
5.0000 mg/kg | INTRAVENOUS | Status: AC
  Administered 2023-03-23: 360 mg via INTRAVENOUS
  Filled 2023-03-23: qty 9

## 2023-03-23 MED ORDER — DEXAMETHASONE SODIUM PHOSPHATE 10 MG/ML IJ SOLN
INTRAMUSCULAR | Status: AC
  Filled 2023-03-23: qty 1

## 2023-03-23 MED ORDER — AMISULPRIDE (ANTIEMETIC) 5 MG/2ML IV SOLN: 10.0000 mg | Freq: Once | INTRAVENOUS | Status: AC

## 2023-03-23 MED ORDER — CLINDAMYCIN PHOSPHATE 900 MG/50ML IV SOLN
900.0000 mg | INTRAVENOUS | Status: AC
  Administered 2023-03-23: 900 mg via INTRAVENOUS

## 2023-03-23 MED ORDER — CHLORHEXIDINE GLUCONATE 0.12 % MT SOLN: 15.0000 mL | Freq: Once | OROMUCOSAL | Status: DC

## 2023-03-23 MED ORDER — LIDOCAINE HCL (PF) 2 % IJ SOLN
INTRAMUSCULAR | Status: AC
  Filled 2023-03-23: qty 5

## 2023-03-23 MED ORDER — LACTATED RINGERS IV SOLN: INTRAVENOUS | Status: DC

## 2023-03-23 SURGICAL SUPPLY — 42 items
APL PRP STRL LF DISP 70% ISPRP (MISCELLANEOUS) ×2
BAG COUNTER SPONGE SURGICOUNT (BAG) IMPLANT
BAG SPNG CNTER NS LX DISP (BAG)
CABLE HIGH FREQUENCY MONO STRZ (ELECTRODE) ×2 IMPLANT
CHLORAPREP W/TINT 26 (MISCELLANEOUS) ×2 IMPLANT
COVER SURGICAL LIGHT HANDLE (MISCELLANEOUS) ×2 IMPLANT
DEVICE SECURE STRAP 25 ABSORB (INSTRUMENTS) IMPLANT
DRAPE WARM FLUID 44X44 (DRAPES) ×2 IMPLANT
DRSG TEGADERM 2-3/8X2-3/4 SM (GAUZE/BANDAGES/DRESSINGS) ×4 IMPLANT
DRSG TEGADERM 4X4.75 (GAUZE/BANDAGES/DRESSINGS) ×2 IMPLANT
ELECT REM PT RETURN 15FT ADLT (MISCELLANEOUS) ×2 IMPLANT
GAUZE SPONGE 2X2 8PLY STRL LF (GAUZE/BANDAGES/DRESSINGS) ×2 IMPLANT
GLOVE ECLIPSE 8.0 STRL XLNG CF (GLOVE) ×2 IMPLANT
GLOVE INDICATOR 8.0 STRL GRN (GLOVE) ×2 IMPLANT
GOWN STRL REUS W/ TWL XL LVL3 (GOWN DISPOSABLE) ×6 IMPLANT
GOWN STRL REUS W/TWL XL LVL3 (GOWN DISPOSABLE) ×6
IRRIG SUCT STRYKERFLOW 2 WTIP (MISCELLANEOUS)
IRRIGATION SUCT STRKRFLW 2 WTP (MISCELLANEOUS) IMPLANT
KIT BASIN OR (CUSTOM PROCEDURE TRAY) ×2 IMPLANT
KIT TURNOVER KIT A (KITS) IMPLANT
MARKER SKIN DUAL TIP RULER LAB (MISCELLANEOUS) ×2 IMPLANT
MESH BARD SOFT 6X6IN (Mesh General) IMPLANT
NDL INSUFFLATION 14GA 120MM (NEEDLE) IMPLANT
NEEDLE INSUFFLATION 14GA 120MM (NEEDLE) IMPLANT
PAD POSITIONING PINK XL (MISCELLANEOUS) ×2 IMPLANT
SCISSORS LAP 5X35 DISP (ENDOMECHANICALS) ×2 IMPLANT
SET TUBE SMOKE EVAC HIGH FLOW (TUBING) ×2 IMPLANT
SLEEVE ADV FIXATION 5X100MM (TROCAR) ×2 IMPLANT
SPIKE FLUID TRANSFER (MISCELLANEOUS) ×2 IMPLANT
SUT MNCRL AB 4-0 PS2 18 (SUTURE) ×2 IMPLANT
SUT PDS AB 1 CT1 27 (SUTURE) ×4 IMPLANT
SUT VIC AB 2-0 SH 27 (SUTURE)
SUT VIC AB 2-0 SH 27X BRD (SUTURE) IMPLANT
SUT VICRYL 0 UR6 27IN ABS (SUTURE) ×2 IMPLANT
SUT VLOC BARB 180 ABS3/0GR12 (SUTURE) ×4
SUTURE VLOC BRB 180 ABS3/0GR12 (SUTURE) IMPLANT
TOWEL OR 17X26 10 PK STRL BLUE (TOWEL DISPOSABLE) ×2 IMPLANT
TOWEL OR NON WOVEN STRL DISP B (DISPOSABLE) ×2 IMPLANT
TRAY FOLEY MTR SLVR 16FR STAT (SET/KITS/TRAYS/PACK) IMPLANT
TRAY LAPAROSCOPIC (CUSTOM PROCEDURE TRAY) ×2 IMPLANT
TROCAR ADV FIXATION 5X100MM (TROCAR) ×2 IMPLANT
TROCAR BALLN 12MMX100 BLUNT (TROCAR) ×2 IMPLANT

## 2023-03-23 NOTE — Op Note (Signed)
03/23/2023  4:55 PM  PATIENT:  Lee Collins  77 y.o. male  Patient Care Team: Johny Blamer, MD as PCP - General (Family Medicine) Karie Soda, MD as Consulting Physician (General Surgery) Jerilee Field, MD as Consulting Physician (Urology) Parke Poisson, MD as Consulting Physician (Cardiology) Kerin Salen, MD as Consulting Physician (Gastroenterology)  PRE-OPERATIVE DIAGNOSIS:   LEFT AND POSSIBLE RIGHT INGUINAL HERNIAS UMBILICAL HERNIA 5mm  POST-OPERATIVE DIAGNOSIS:  BILATERAL INGUINAL HERNIAS UMBILICAL HERNIA 5mm  PROCEDURE:   LAPAROSCOPIC BILATERAL INGUINAL HERNIA REPAIR WITH MESH SUTURE PRIMARY REPAIR OF UMBILICAL HERNIA TRANSVERSUS ABDOMINIS PLANE (TAP) BLOCK - BILATERAL  SURGEON:  Ardeth Sportsman, MD  ASSISTANT:  Resident, PGY -5 Lenord Fellers, [employer]  Puja Gosai Maczis, PA-C  ANESTHESIA:  General endotracheal intubation anesthesia (GETA) and Regional TRANSVERSUS ABDOMINIS PLANE (TAP) nerve block -BILATERAL for perioperative & postoperative pain control at the level of the transverse abdominis & preperitoneal spaces along the flank at the anterior axillary line, from subcostal ridge to iliac crest under laparoscopic guidance provided with liposomal bupivacaine (Experel) 20mL mixed with 50 mL of bupivicaine 0.25%  Estimated Blood Loss (EBL):   Total I/O In: 284 [I.V.:125; IV Piggyback:159] Out: 75 [Blood:75].   (See anesthesia record)  Delay start of Pharmacological VTE agent (>24hrs) due to concerns of significant anemia, surgical blood loss, or risk of bleeding?:  no  DRAINS: (None)  SPECIMEN:  Cord lipoma (not sent)  DISPOSITION OF SPECIMEN:  (not applicable)  COUNTS:  Sponge, needle, & instrument counts CORRECT  PLAN OF CARE: Discharge to home after PACU  PATIENT DISPOSITION:  PACU - hemodynamically stable.  INDICATION: Pleasant active male with obvious left-sided inguinal hernia.  Smaller definite right inguinal hernia as well.  5 mm  umbilical hernia as well.  Recommendation made for operative exploration and repair of hernias found.  The anatomy & physiology of the abdominal wall and pelvic floor was discussed.  The pathophysiology of hernias in the inguinal and pelvic region was discussed.  Natural history risks such as progressive enlargement, pain, incarceration & strangulation was discussed.   Contributors to complications such as smoking, obesity, diabetes, prior surgery, etc were discussed.    I feel the risks of no intervention will lead to serious problems that outweigh the operative risks; therefore, I recommended surgery to reduce and repair the hernia.  I explained laparoscopic techniques with possible need for an open approach.  I noted usual use of mesh to patch and/or buttress hernia repair  Risks such as bleeding, infection, abscess, need for further treatment, heart attack, death, and other risks were discussed.  I noted a good likelihood this will help address the problem.   Goals of post-operative recovery were discussed as well.  Possibility that this will not correct all symptoms was explained.  I stressed the importance of low-impact activity, aggressive pain control, avoiding constipation, & not pushing through pain to minimize risk of post-operative chronic pain or injury. Possibility of reherniation was discussed.  We will work to minimize complications.     An educational handout further explaining the pathology & treatment options was given as well.  Questions were answered.  The patient expresses understanding & wishes to proceed with surgery.  OR FINDINGS: Moderate size LEFT indirect inguinal hernia with large spermatic cord lipoma.  Mild laxity in the direct space but no definite hernia there.  No femoral or obturator hernia.  On the RIGHT side, smaller but definite indirect inguinal hernia.  No direct space, femoral, obturator hernia.  5 mm umbilical hernia through the base of the stalk.  Primarily  repaired.  DESCRIPTION:  The patient was identified & brought into the operating room. The patient was positioned supine with arms tucked. SCDs were active during the entire case. The patient underwent general anesthesia without any difficulty.  The abdomen was prepped and draped in a sterile fashion. The patient's bladder was emptied.  A Surgical Timeout confirmed our plan.  I made a transverse incision through the inferior umbilical fold.  I made a small transverse nick through the anterior rectus fascia contralateral to the inguinal hernia side and placed a 0-vicryl stitch through the fascia.  I placed a Hasson trocar into the preperitoneal plane.  Entry was clean.  We induced carbon dioxide insufflation. Camera inspection revealed no injury.  I used a 10mm angled scope to bluntly free the peritoneum off the infraumbilical anterior abdominal wall.  I created enough of a preperitoneal pocket to place 5mm ports into the right & left mid-abdomen into this preperitoneal cavity.  I focused attention on the LEFT pelvis since that was the dominant hernia side.   I used blunt & focused sharp dissection to free the peritoneum off the flank and down to the pubic rim.  I freed the anteriolateral bladder wall off the anteriolateral pelvic wall, sparing midline attachments.   I located a swath of peritoneum going into a hernia fascial defect at the  internal ring consistent with  an indirect inguinal hernia..  I gradually freed the peritoneal hernia sac off safely and reduced it into the preperitoneal space.  I freed the peritoneum off the spermatic vessels & vas deferens.  I freed peritoneum off the retroperitoneum along the psoas muscle.  Inguinal canal cord lipoma was dissected away & removed.  I checked & assured hemostasis.     I turned attention on the opposite  RIGHT pelvis.  I did dissection in a similar, mirror-image fashion. The patient had an indirect inguinal hernia..   Ingunial canal cord lipoma was  dissected away & removed.    I checked & assured hemostasis.  Peritoneal repair done along with ligation of the sac.  Patient had history of prior appendectomy so had some adhesions there.  Suture repair done laparoscopically with 3-0 Vicryl absorbable serrated suture.  I chose sheets of lightweight polypropylene Bard Soft Mesh 15x15cm, one for each side.  I cut a single sigmoid-shaped slit ~6cm from a corner of each mesh.  I placed the meshes into the preperitoneal space & laid them as overlapping diamonds such that at the inferior points, a 6x6 cm corner flap rested in the true anterolateral pelvis, covering the obturator & femoral foramina.   I allowed the bladder to return to the pubis, this helping tuck the corners of the mesh in the anteriolateral pelvis.  The medial corners overlapped each other across midline cephalad to the pubic rim.   Given the numerous hernias of moderate size, I placed a third 15x15cm mesh in the center as a vertical diamond.  The lateral wings of the mesh overlap across the direct spaces and internal rings where the dominant hernias were.  This provided good coverage and reinforcement of the hernia repairs.  Because of the central mesh placement with good overlap, I did not place any tacks.   I held the hernia sacs cephalad & evacuated carbon dioxide.  I closed the fascia with absorbable suture.  I closed the skin using 4-0 monocryl stitch.  Sterile dressings were applied.  The patient was extubated & arrived in the PACU in stable condition..  I had discussed postoperative care with the patient in the holding area.  Instructions are written in the chart.  I discussed operative findings, updated the patient's status, discussed probable steps to recovery, and gave postoperative recommendations to the patient's spouse.  Recommendations were made.  Questions were answered.  She expressed understanding & appreciation.   Ardeth Sportsman, M.D., F.A.C.S. Gastrointestinal and  Minimally Invasive Surgery [facility] [address] [place], [place] [address] [phone] Main / Paging  03/23/2023 4:55 PM

## 2023-03-23 NOTE — Transfer of Care (Signed)
Immediate Anesthesia Transfer of Care Note  Patient: Lee Collins  Procedure(s) Performed: LAPAROSCOPIC BILATERAL INGUINAL HERNIA REPAIR WITH MESH (Bilateral) SUTURE PRIMARY REPAIR OF UMBILICAL HERNIA  Patient Location: PACU  Anesthesia Type:General  Level of Consciousness: awake, drowsy, and responds to stimulation  Airway & Oxygen Therapy: Patient Spontanous Breathing and Patient connected to face mask oxygen  Post-op Assessment: Report given to RN and Post -op Vital signs reviewed and stable  Post vital signs: Reviewed and stable  Last Vitals:  Vitals Value Taken Time  BP 165/90 03/23/23 1657  Temp    Pulse 81 03/23/23 1700  Resp 11 03/23/23 1700  SpO2 100 % 03/23/23 1700  Vitals shown include unvalidated device data.  Last Pain:  Vitals:   03/23/23 1244  PainSc: 0-No pain      Patients Stated Pain Goal: 4 (03/23/23 1244)  Complications: No notable events documented.

## 2023-03-23 NOTE — H&P (Signed)
03/23/2023   REFERRING PHYSICIAN: Nolene Ebbs*  Patient Care Team: Nolene Ebbs, MD as PCP - General (Family Medicine) Michaell Cowing, Shawn Route, MD as Consulting Provider (General Surgery) Antony Contras, MD (Otolaryngology) Sebastian Ache, MD (Urology) Mena Goes Lowella Petties, MD (Urology)  PROVIDER: Jarrett Soho, MD  [facility] MRN: [id number] DOB: 1946-02-20  SUBJECTIVE  Chief Complaint: Hernia   Lee Collins is a 77 y.o. male who is seen today as an office consultation at the request of DrTiburcio Pea for evaluation of left groin pain and probable hernia  History of Present Illness:  77 year old active male. Noticed some discomfort in his left lower side of his abdomen. CT scan raise concern of an obvious left inguinal hernia. Surgical consultation offered. He wished to come back to me given I had operated on him before. I fixed a chronically incarcerated hernia in 2014 for which he seemed to recover well and has no strong evidence of recurrence.  Patient runs rather regularly and noticed some worsening groin discomfort. Was helping do some yard work. Noticed when he coughs and sneezes and lifting. Inguinal hernia suspected and CAT scan confirms that. He gets rare dysphagia to steaks and crusty breads but otherwise denies any heartburn reflux or problems with his hiatal hernia. No evidence recurrence on his CAT scan. He had issues with hematuria around the time operated on him but after 2 procedures that is doing better which is very rare he is not on any issues with difficulty urinating. Nocturia is pretty rare now. Normally can run several miles. Walking right now since running hurts his groin as well. He was hoping to get this done in July after vacation and colonoscopy. He apparently was diagnosed with some calcifications and is due to see cardiology. He has some cholesterol issues but has not been able to tolerate statins. Diabetic. He does not smoke. He  occasionally takes aspirin but otherwise no other blood thinners.  Medical History:  History reviewed. No pertinent past medical history.  Patient Active Problem List Diagnosis Non-recurrent bilateral inguinal hernia without obstruction or gangrene Umbilical hernia without obstruction or gangrene History of hematuria  Past Surgical History: Procedure Laterality Date HERNIA REPAIR PROSTATE SURGERY   Allergies Allergen Reactions Latex Rash Penicillins Other (See Comments) and Rash Blister like areas on face Povidone-Iodine Rash  Current Outpatient Medications on File Prior to Visit Medication Sig Dispense Refill ibuprofen (MOTRIN) 200 MG tablet Take by mouth ondansetron (ZOFRAN) 4 MG tablet Take 4 mg by mouth  No current facility-administered medications on file prior to visit.  Family History Problem Relation Age of Onset High blood pressure (Hypertension) Mother Coronary Artery Disease (Blocked arteries around heart) Mother Coronary Artery Disease (Blocked arteries around heart) Father High blood pressure (Hypertension) Father High blood pressure (Hypertension) Brother Coronary Artery Disease (Blocked arteries around heart) Brother   Social History  Tobacco Use Smoking Status Never Smokeless Tobacco Never   Social History  Socioeconomic History Marital status: Married Tobacco Use Smoking status: Never Smokeless tobacco: Never Substance and Sexual Activity Alcohol use: Yes Drug use: Not Currently  ############################################################  Review of Systems: A complete review of systems (ROS) was obtained from the patient. We have reviewed this information and discussed as appropriate with the patient. See HPI as well for other pertinent ROS.  Constitutional: No fevers, chills, sweats. Weight stable Eyes: No vision changes, No discharge HENT: No sore throats, nasal drainage Lymph: No neck swelling, No bruising easily Pulmonary:  No cough, productive sputum CV: No orthopnea,  PND . No exertional chest/neck/shoulder/arm pain. Patient can walk 2-3 miles gradually.  GI: No personal nor family history of GI/colon cancer, inflammatory bowel disease, irritable bowel syndrome, allergy such as Celiac Sprue, dietary/dairy problems, colitis, ulcers nor gastritis. No recent sick contacts/gastroenteritis. No travel outside the country. No changes in diet.  Renal: No UTIs, No hematuria Genital: No drainage, bleeding, masses Musculoskeletal: No severe joint pain. Good ROM major joints Skin: No sores or lesions Heme/Lymph: No easy bleeding. No swollen lymph nodes Neuro: No active seizures. No facial droop Psych: No hallucinations. No agitation  OBJECTIVE  Vitals: 02/23/23 0833 BP: (!) 158/82 Pulse: 63 Weight: 73.9 kg (163 lb) Height: 172.7 cm (5\' 8" )  Body mass index is 24.78 kg/m.  PHYSICAL EXAM:  Constitutional: Not cachectic. Hygeine adequate. Vitals signs as above. Eyes: No glasses. Vision adequate,Pupils reactive, normal extraocular movements. Sclera nonicteric Neuro: CN II-XII intact. No major focal sensory defects. No major motor deficits. Lymph: No head/neck/groin lymphadenopathy Psych: No severe agitation. No severe anxiety. Judgment & insight Adequate, Oriented x4, HENT: Normocephalic, Mucus membranes moist. No thrush. Hearing: adequate Neck: Supple, No tracheal deviation. No obvious thyromegaly Chest: No pain to chest wall compression. Good respiratory excursion. No audible wheezing CV: Pulses intact. regular. No major extremity edema Ext: No obvious deformity or contracture. Edema: Not present. No cyanosis Skin: No major subcutaneous nodules. Warm and dry Musculoskeletal: Severe joint rigidity not present. Some mild kyphosis of thoracic pain no obvious clubbing. No digital petechiae. Mobility: no assist device moving easily without restrictions  Abdomen: Flat Soft. Nondistended. Nontender. Hernia: 5 x 5 mm  umbilicus at base of umbilical stalk only.  Diastasis recti: Not present. No hepatomegaly. No splenomegaly.  Genital/Pelvic: Inguinal hernia: obvious left groin hernia bulge mildly sensitive but reducible. More subtle bulge on the right side suspicious for small inguinal hernia as well. Inguinal lymph nodes: without lymphadenopathy nor hidradenitis.  Rectal: (Deferred)    ###################################################################  Labs, Imaging and Diagnostic Testing:  Located in 'Care Everywhere' section of Epic EMR chart  PRIOR [facility] CLINIC NOTES:  Located in 'Care Everywhere' section of Epic EMR chart  SURGERY NOTES:  Located in 'Care Everywhere' section of Epic EMR chart  PATHOLOGY:  Not applicable  Assessment and Plan: DIAGNOSES:  Diagnoses and all orders for this visit:  Non-recurrent bilateral inguinal hernia without obstruction or gangrene  Umbilical hernia without obstruction or gangrene  History of hematuria    ASSESSMENT/PLAN  Pleasant active male with worsening left groin pain with obvious left renal hernia by exam and CT scan and probable small radial hernia by CT scan and exam as well. Small umbilical hernia.  I think he would benefit from repair given his activity level. Laparoscopic approach. That will allow me to fully fix an early right inguinal hernia as well. Plan incision near the bellybutton so I can do a suture repair of his small umbilical hernia. Outpatient surgery:  The anatomy & physiology of the abdominal wall and pelvic floor was discussed. The pathophysiology of hernias in the inguinal and pelvic region was discussed. Natural history risks such as progressive enlargement, pain, incarceration, and strangulation was discussed. Contributors to complications such as smoking, obesity, diabetes, prior surgery, etc were discussed.  I feel the risks of no intervention will lead to serious problems that outweigh the operative risks;  therefore, I recommended surgery to reduce and repair the hernia. I explained laparoscopic techniques with possible need for an open approach. I noted usual use of mesh to patch and/or buttress  hernia repair  Risks such as bleeding, infection, abscess, need for further treatment, injury to other organs, need for repair of tissues / organs, stroke, heart attack, death, and other risks were discussed. I noted a good likelihood this will help address the problem. Goals of post-operative recovery were discussed as well. Possibility that this will not correct all symptoms was explained. I stressed the importance of low-impact activity, aggressive pain control, avoiding constipation, & not pushing through pain to minimize risk of post-operative chronic pain or injury. Possibility of reherniation was discussed. We will work to minimize complications.  An educational handout further explaining the pathology & treatment options was given as well. Questions were answered. The patient expresses understanding & wishes to proceed with surgery.  Has moderate asymptomatic CAD - followed by cardiology & cleared given low risk surgery & good performance status  He had issues with urinary tension wanted his hiatal hernia pair 10 years ago with some hematuria. After 2 procedures that has gone away and he has minimal hematuria and rare nocturia. Some BPH but not obstructing and followed by [facility] primarily now Dr. Mena Goes. Plan to do POUR postop urinary retention prevention and hopefully he will get through that safely.

## 2023-03-23 NOTE — Anesthesia Procedure Notes (Signed)
Procedure Name: Intubation Date/Time: 03/23/2023 2:46 PM  Performed by: Elyn Peers, CRNAPre-anesthesia Checklist: Patient identified, Emergency Drugs available, Suction available, Patient being monitored and Timeout performed Patient Re-evaluated:Patient Re-evaluated prior to induction Oxygen Delivery Method: Circle system utilized Preoxygenation: Pre-oxygenation with 100% oxygen Induction Type: IV induction Ventilation: Mask ventilation without difficulty Laryngoscope Size: Mac and 4 Grade View: Grade II Tube type: Oral Tube size: 7.5 mm Number of attempts: 1 Airway Equipment and Method: Stylet Placement Confirmation: ETT inserted through vocal cords under direct vision, positive ETCO2 and breath sounds checked- equal and bilateral Secured at: 24 cm Tube secured with: Tape Dental Injury: Teeth and Oropharynx as per pre-operative assessment

## 2023-03-23 NOTE — Discharge Instructions (Signed)
HERNIA REPAIR: POST OP INSTRUCTIONS  ######################################################################  EAT Gradually transition to a high fiber diet with a fiber supplement over the next few weeks after discharge.  Start with a pureed / full liquid diet (see below)  WALK Walk an hour a day.  Control your pain to do that.    CONTROL PAIN Control pain so that you can walk, sleep, tolerate sneezing/coughing, and go up/down stairs.  HAVE A BOWEL MOVEMENT DAILY Keep your bowels regular to avoid problems.  OK to try a laxative to override constipation.  OK to use an antidairrheal to slow down diarrhea.  Call if not better after 2 tries  CALL IF YOU HAVE PROBLEMS/CONCERNS Call if you are still struggling despite following these instructions. Call if you have concerns not answered by these instructions  ######################################################################    DIET: Follow a light bland diet & liquids the first 24 hours after arrival home, such as soup, liquids, starches, etc.  Be sure to drink plenty of fluids.  Quickly advance to a usual solid diet within a few days.  Avoid fast food or heavy meals as your are more likely to get nauseated or have irregular bowels.  A low-fat, high-fiber diet for the rest of your life is ideal.   Take your usually prescribed home medications unless otherwise directed.  PAIN CONTROL: Pain is best controlled by a usual combination of three different methods TOGETHER: Ice/Heat Over the counter pain medication Prescription pain medication Most patients will experience some swelling and bruising around the hernia(s) such as the bellybutton, groins, or old incisions.  Ice packs or heating pads (30-60 minutes up to 6 times a day) will help. Use ice for the first few days to help decrease swelling and bruising, then switch to heat to help relax tight/sore spots and speed recovery.  Some people prefer to use ice alone, heat alone, alternating  between ice & heat.  Experiment to what works for you.  Swelling and bruising can take several weeks to resolve.   It is helpful to take an over-the-counter pain medication regularly for the first few weeks.  Choose one of the following that works best for you: Naproxen (Aleve, etc)  Two 220mg tabs twice a day Ibuprofen (Advil, etc) Three 200mg tabs four times a day (every meal & bedtime) Acetaminophen (Tylenol, etc) 325-650mg four times a day (every meal & bedtime) A  prescription for pain medication should be given to you upon discharge.  Take your pain medication as prescribed.  If you are having problems/concerns with the prescription medicine (does not control pain, nausea, vomiting, rash, itching, etc), please call us [phone] to see if we need to switch you to a different pain medicine that will work better for you and/or control your side effect better. If you need a refill on your pain medication, please contact your pharmacy.  They will contact our office to request authorization. Prescriptions will not be filled after 5 pm or on week-ends.  AVOID GETTING CONSTIPATED.   Between the surgery and the pain medications, it is common to experience some constipation.  Drink plenty of liquids Take a fiber supplement 2 times day (such as Metamucil, Citrucel, FiberCon, MiraLax, etc) to have a bowel movement every day. If you have not had a BM by 2 days after surgery: -drink liquids only until you have a bowel movement -take MiraLAX 2 doses every 2 hours until you have a bowel movement   Wash / shower every day.  You may   shower over the dressings as they are waterproof.    It is good for closed incisions and even open wounds to be washed every day.  Shower every day.  Short baths are fine.  Wash the incisions and wounds clean with soap & water.    You may leave closed incisions open to air if it is dry.   You may cover the incision with clean gauze & replace it after your daily shower for  comfort.  TEGADERM:  You have clear gauze band-aid dressings over your closed incision(s).  Remove the dressings 3 days after surgery.    ACTIVITIES as tolerated:   You may resume regular (light) daily activities beginning the next day--such as daily self-care, walking, climbing stairs--gradually increasing activities as tolerated.  Control your pain so that you can walk an hour a day.  If you can walk 30 minutes without difficulty, it is safe to try more intense activity such as jogging, treadmill, bicycling, low-impact aerobics, swimming, etc. Save the most intensive and strenuous activity for last such as sit-ups, heavy lifting, contact sports, etc  Refrain from any heavy lifting or straining until you are off narcotics for pain control.   DO NOT PUSH THROUGH PAIN.  Let pain be your guide: If it hurts to do something, don't do it.  Pain is your body warning you to avoid that activity for another week until the pain goes down. You may drive when you are no longer taking prescription pain medication, you can comfortably wear a seatbelt, and you can safely maneuver your car and apply brakes. You may have sexual intercourse when it is comfortable.   FOLLOW UP in our office Please call [facility] at [phone] to set up an appointment to see your surgeon in the office for a follow-up appointment approximately 2-3 weeks after your surgery. Make sure that you call for this appointment the day you arrive home to insure a convenient appointment time.  9.  If you have disability of FMLA / Family leave forms, please bring the forms to the office for processing.  (do not give to your surgeon).  WHEN TO CALL US [phone]: Poor pain control Reactions / problems with new medications (rash/itching, nausea, etc)  Fever over 101.5 F (38.5 C) Inability to urinate Nausea and/or vomiting Worsening swelling or bruising Continued bleeding from incision. Increased pain, redness, or drainage from the  incision   The clinic staff is available to answer your questions during regular business hours (8:30am-5pm).  Please don't hesitate to call and ask to speak to one of our nurses for clinical concerns.   If you have a medical emergency, go to the nearest emergency room or call 911.  A surgeon from [facility] is always on call at the hospitals in [place]  [facility] [address], [address], [place], [place]  [address] ?  [address] [address], [place], [place]   [address] MAIN: [phone] ? TOLL FREE: [phone] ? FAX: [phone] [email or web address]  

## 2023-03-24 ENCOUNTER — Encounter (HOSPITAL_COMMUNITY): Payer: Self-pay | Admitting: Surgery

## 2023-03-25 DIAGNOSIS — N400 Enlarged prostate without lower urinary tract symptoms: Secondary | ICD-10-CM | POA: Diagnosis not present

## 2023-03-25 DIAGNOSIS — Z0000 Encounter for general adult medical examination without abnormal findings: Secondary | ICD-10-CM | POA: Diagnosis not present

## 2023-03-25 DIAGNOSIS — I2510 Atherosclerotic heart disease of native coronary artery without angina pectoris: Secondary | ICD-10-CM | POA: Diagnosis not present

## 2023-03-25 DIAGNOSIS — E7800 Pure hypercholesterolemia, unspecified: Secondary | ICD-10-CM | POA: Diagnosis not present

## 2023-03-27 NOTE — Anesthesia Postprocedure Evaluation (Signed)
Anesthesia Post Note  Patient: Lee Collins  Procedure(s) Performed: LAPAROSCOPIC BILATERAL INGUINAL HERNIA REPAIR WITH MESH (Bilateral) SUTURE PRIMARY REPAIR OF UMBILICAL HERNIA     Patient location during evaluation: PACU Anesthesia Type: General Level of consciousness: awake and alert Pain management: pain level controlled Vital Signs Assessment: post-procedure vital signs reviewed and stable Respiratory status: spontaneous breathing, nonlabored ventilation, respiratory function stable and patient connected to nasal cannula oxygen Cardiovascular status: blood pressure returned to baseline and stable Postop Assessment: no apparent nausea or vomiting Anesthetic complications: no  No notable events documented.  Last Vitals:  Vitals:   03/23/23 1900 03/23/23 2001  BP: 137/72 129/71  Pulse: 96 98  Resp:  18  Temp: 36.5 C 36.5 C  SpO2: 98% 100%    Last Pain:  Vitals:   03/23/23 1900  PainSc: 7                  Zakarie Sturdivant L Quentez Lober

## 2023-03-28 ENCOUNTER — Ambulatory Visit: Payer: BLUE CROSS/BLUE SHIELD | Admitting: Internal Medicine

## 2023-03-29 ENCOUNTER — Ambulatory Visit: Payer: BLUE CROSS/BLUE SHIELD | Admitting: Student

## 2023-03-30 ENCOUNTER — Ambulatory Visit: Payer: BLUE CROSS/BLUE SHIELD | Admitting: Physician Assistant

## 2023-03-31 NOTE — Progress Notes (Signed)
Cardiology Clinic Note   Date: 04/01/2023 ID: [name], DOB [birth year], MRN [id number]  Primary Cardiologist:  Parke Poisson, MD  Patient Profile    Lee Collins is a 77 y.o. male who presents to the clinic today for follow up after LHC.     Past medical history significant for: CAD. Coronary CTA 03/09/2023: Calcium score 3934 (99th percentile).  Borderline hemodynamically significant stenosis proximal to mid RCA, extensive plaque.  Suspect hemodynamically significant stenosis in mid LAD.  Nonobstructive disease in the LCx.  There is small to moderate PLOM with diffuse disease, could have severe stenosis but unable to do FFR in this vessel. LHC 03/15/2023: Proximal RCA #1 90%, #2 90%.  Mid RCA 80%.  Mid to distal RCA 55%.  Proximal LAD 60%.  Mid LAD 40%.  D1 75%.  Mid to distal Cx 70%.  LVEF 55 to 65%.  Recommendation for aggressive medical therapy and risk factor modification.  In absence of symptoms there is no indication for intervention. Hyperlipidemia. Lipid panel 03/25/2023: LDL 149, HDL 59, TG 151, total 235.     History of Present Illness    Lee Collins was first evaluated by Dr. Jacques Navy on 02/25/2023 for CAD at the request of Dr. Tiburcio Pea.  Recent abdominal CT noted severe three-vessel coronary artery calcifications.  This was also reported on the CT PE study in 2014.  Patient reported exertional left lower quadrant abdominal pain but no chest pain.  He reported walking 6 miles a day with no chest discomfort.  He had recently traveled to [facility] to participate in a "fourteener" but was only able to complete 13.6 miles.  His symptoms were attributed to altitude sickness as they improved when he returned to normal elevation.  Patient underwent bilateral inguinal hernia repair on 03/23/2023.  Today, patient is accompanied by his wife. He is doing very well since his heart catheterization and hernia surgery.  Patient denies shortness of breath or dyspnea on exertion. No chest pain,  pressure, or tightness. Denies lower extremity edema, orthopnea, or PND. No palpitations.  He normally runs 5-6 miles 5-6 days a week. He has started back to walking this week. He would like to know if he can resume hiking when cleared by surgeon. He follows a healthy diet with a lot of fresh fruits and vegetables and limiting red meat. He is intolerant to statins secondary to myalgias.     ROS: All other systems reviewed and are otherwise negative except as noted in History of Present Illness.  Studies Reviewed    ECG is not ordered today.         Physical Exam    VS:  BP 128/72 (BP Location: Left Arm, Patient Position: Sitting, Cuff Size: Normal)   Pulse 72   Ht 5\' 8"  (1.727 m)   Wt 164 lb 12.8 oz (74.8 kg)   SpO2 100%   BMI 25.06 kg/m  , BMI Body mass index is 25.06 kg/m.  GEN: Well nourished, well developed, in no acute distress. Neck: No JVD or carotid bruits. Cardiac:  RRR. No murmurs. No rubs or gallops.   Respiratory:  Respirations regular and unlabored. Clear to auscultation without rales, wheezing or rhonchi. GI: Soft, nontender, nondistended. Extremities: Radials/DP/PT 2+ and equal bilaterally. No clubbing or cyanosis. No edema.  Skin: Warm and dry, no rash. Neuro: Strength intact.  Assessment & Plan    CAD.  LHC May 2024 showed obstructive disease with the most severe disease in the RCA which is  diffusely diseased, moderate obstructive disease in diagonal and distal LCx, proximal LAD with borderline stenosis; all vessel severely calcified.  Aggressive medical therapy and risk factor modification recommended secondary to pain-free.  Patient denies chest pain, tightness, pressure.  He has very active typically running 5 to 6 miles 5 to 6 days a week.  He underwent bilateral inguinal hernia repair on 03/23/2023.  He has started back walking this week.  He follows a healthy diet with a lot of fresh fruits and vegetables and limiting red meat.  Continue  aspirin. Hyperlipidemia/myalgia secondary to statins.  LDL June 2024 149, not at goal.  Patient has a history of statin intolerance secondary to significant myalgias.  He reports in the past when he was taking a statin he was unable to run secondary to leg pain.  He was able to speak to Pharm.D. today.  He will get started on Repatha.  Disposition: Begin Repatha per Pharm D. Return in 3 to 4 months or sooner as needed.         Signed, Etta Grandchild. Khylen Riolo, DNP, NP-C

## 2023-04-01 ENCOUNTER — Encounter: Payer: Self-pay | Admitting: Student

## 2023-04-01 ENCOUNTER — Telehealth: Payer: Self-pay | Admitting: Pharmacist Clinician (PhC)/ Clinical Pharmacy Specialist

## 2023-04-01 ENCOUNTER — Ambulatory Visit: Payer: Medicare Other | Attending: Physician Assistant | Admitting: Student

## 2023-04-01 ENCOUNTER — Telehealth: Payer: Self-pay

## 2023-04-01 ENCOUNTER — Other Ambulatory Visit (HOSPITAL_COMMUNITY): Payer: Self-pay

## 2023-04-01 ENCOUNTER — Ambulatory Visit: Payer: Medicare Other

## 2023-04-01 VITALS — BP 128/72 | HR 72 | Ht 68.0 in | Wt 164.8 lb

## 2023-04-01 DIAGNOSIS — M791 Myalgia, unspecified site: Secondary | ICD-10-CM

## 2023-04-01 DIAGNOSIS — E785 Hyperlipidemia, unspecified: Secondary | ICD-10-CM | POA: Insufficient documentation

## 2023-04-01 DIAGNOSIS — T466X5A Adverse effect of antihyperlipidemic and antiarteriosclerotic drugs, initial encounter: Secondary | ICD-10-CM | POA: Diagnosis not present

## 2023-04-01 DIAGNOSIS — I2510 Atherosclerotic heart disease of native coronary artery without angina pectoris: Secondary | ICD-10-CM | POA: Insufficient documentation

## 2023-04-01 NOTE — Patient Instructions (Addendum)
Medication Instructions:  Your physician recommends that you continue on your current medications as directed. Please refer to the Current Medication list given to you today.  *If you need a refill on your cardiac medications before your next appointment, please call your pharmacy*   Lab Work: NONE If you have labs (blood work) drawn today and your tests are completely normal, you will receive your results only by: MyChart Message (if you have MyChart) OR A paper copy in the mail If you have any lab test that is abnormal or we need to change your treatment, we will call you to review the results.   Testing/Procedures: NONE   Follow-Up: At [facility], you and your health needs are our priority.  As part of our continuing mission to provide you with exceptional heart care, we have created designated Provider Care Teams.  These Care Teams include your primary Cardiologist (physician) and Advanced Practice Providers (APPs -  Physician Assistants and Nurse Practitioners) who all work together to provide you with the care you need, when you need it.  We recommend signing up for the patient portal called "MyChart".  Sign up information is provided on this After Visit Summary.  MyChart is used to connect with patients for Virtual Visits (Telemedicine).  Patients are able to view lab/test results, encounter notes, upcoming appointments, etc.  Non-urgent messages can be sent to your provider as well.   To learn more about what you can do with MyChart, go to [email or web address].    Your next appointment:   3-4 month(s) (appointment has been scheduled)   Provider:   Parke Poisson, MD

## 2023-04-01 NOTE — Telephone Encounter (Signed)
   PTS PLAN DOESN'T COVER REPATHA PRODUCT/SERVICE NOT COVERED ''CLOSED FORM''

## 2023-04-01 NOTE — Telephone Encounter (Signed)
Patient was in office to see Carlos Levering NP.  Recent Calcium score was 3934.  Has no personal history of coronary disease, runs several miles most days a week without issue.    He has been intolerant to statin drugs (myalgias) and we reviewed options of PCSK9 inhibitors vs inclisiran.  (Needs 50%+ drop in LDL).  Answered all patient questions.   Will start Repatha 140 mg q14d with repeat labs in 3 months.    Offered information for [employer], they will confirm income and let me know once PA approved.     Tech team - please do PA for Repatha

## 2023-04-01 NOTE — Telephone Encounter (Signed)
Praluent would be fine.  150 mg q14d please

## 2023-04-04 ENCOUNTER — Ambulatory Visit: Payer: BLUE CROSS/BLUE SHIELD

## 2023-04-04 ENCOUNTER — Other Ambulatory Visit (HOSPITAL_COMMUNITY): Payer: Self-pay

## 2023-04-04 ENCOUNTER — Telehealth: Payer: Self-pay

## 2023-04-04 NOTE — Telephone Encounter (Signed)
Pharmacy Patient Advocate Encounter  Prior Authorization for PRALUENT 150MG /ML has been APPROVED by [facility] MEDICAID  from 6.3.24 UNTIL FURTHER NOTICE

## 2023-04-04 NOTE — Telephone Encounter (Signed)
Pharmacy Patient Advocate Encounter   Received notification from [facility] that prior authorization for PARLUENT 150MG /ML is required/requested.   PA submitted to [facility] MEDICAID  via CoverMyMeds Key or [facility]) confirmation # [id number]   Status is pending

## 2023-04-04 NOTE — Telephone Encounter (Signed)
What about Praluent 150 mg?  Would they cover that?

## 2023-04-04 NOTE — Telephone Encounter (Signed)
Yes, that p/a has been submitted and is pending

## 2023-04-05 MED ORDER — PRALUENT 150 MG/ML ~~LOC~~ SOAJ: 150.0000 mg | SUBCUTANEOUS | 3 refills | Status: DC

## 2023-04-05 NOTE — Telephone Encounter (Signed)
Rx sent to [facility]

## 2023-04-18 ENCOUNTER — Ambulatory Visit: Payer: BLUE CROSS/BLUE SHIELD | Admitting: Internal Medicine

## 2023-04-20 ENCOUNTER — Ambulatory Visit: Payer: BLUE CROSS/BLUE SHIELD | Admitting: Internal Medicine

## 2023-04-28 ENCOUNTER — Ambulatory Visit: Payer: BLUE CROSS/BLUE SHIELD

## 2023-05-26 ENCOUNTER — Telehealth: Payer: Self-pay | Admitting: *Deleted

## 2023-05-26 ENCOUNTER — Other Ambulatory Visit: Payer: Self-pay | Admitting: Gastroenterology

## 2023-05-26 NOTE — Telephone Encounter (Signed)
   Pre-operative Risk Assessment    Patient Name: Lee Collins  DOB: 05-30-1946 MRN: [id number]   REQUESTING OFFICE SENT OVER CLEARANCE REQUEST x 2 TODAY, THE FIRST STATED DATE OF PROCEDURE POSSIBLY 8/12/4; THE SECOND REQUEST CAME OVER TODAY AS WELL STATING DATE OF PROCEDURE 05/30/23 STAT   Request for Surgical Clearance    Procedure:   COLONOSCOPY  Date of Surgery:  Clearance 05/30/23                                 Surgeon:  DR. [facility] Surgeon's Group or Practice Name:  [facility] Phone number:  [phone] Fax number:  [phone]   Type of Clearance Requested:   - Medical ; NO MEDICATIONS LISTED AS NEEDING TO BE HELD   Type of Anesthesia:   PROPOFOL   Additional requests/questions:    Elpidio Anis   05/26/2023, 2:06 PM

## 2023-05-27 NOTE — H&P (Addendum)
History of Present Illness    General:  This is a 77 year old patient, here today to set up repeat colonoscopy.  Medical history includes CAD, GERD, HSV, hyperlipidemia, adenomatous colon polyps, & BPH. Had recent bilateral inguinal hernia repair, June 5th, 2024.   Patient also recently established care with cardiology on 04/01/2023, for the evaluation of coronary artery disease.  He had a recent abdominal CT that noted severe three-vessel coronary artery calcifications, so was referred to cardiology for further evaluation.  Patient underwent cardiac catheterization in May 2024 which showed obstructive disease with the most severe disease in the RCA which is diffusely diseased, moderate obstructive disease in diagonal and distal LCx, proximal LAD with borderline stenosis: All vessel severely calcified. Cardiology did recommend patient's start an 81 mg aspirin daily. He was also started on Repatha.  Patient to follow-up with cardiology in October.  GI History: Colonoscopy: 01/03/2020, 3 sessile serrated polyps removed, repeat in 3 years.  CT Abdomen: 12/21/2022, no acute intra-abdominal pathology, left inguinal hernia, moderate colonic stool burden without bowel obstruction.  Alarm Symptoms: Anemia: Denies Unintentional Weight Loss: Denies Change in Appetite: Denies Change in Bowel Habits: Denies Trouble Swallowing: Denies Blood in Stool: Denies Black Stools: Denies  Abdominal Pain: Denies N/V: Denies Reflux: Denies Belching or abdominal bloating: Denies NSAID Use: Takes ibuprofen most nights  Blood Thinners: Denies, takes 81 mg aspirin daily  Major medical events over past 6 months: Hernia surgery in June 2024 Cardiologist: Dr. Hollie Salk, only seen once due to CT scan that showed some calcifications   Family History of Colon Cancer: Denies.  Current Medications Taking Aller-Ease 60 MG Tablet 1 tablet as needed Orally Twice a day , Notes to Pharmacist: as  needed Chlorphen(Chlorpheniramine Maleate) 4 MG Tablet 1 tablet Orally daily Clotrimazole-Betamethasone 1-0.05 % Cream 1 application Externally Twice a day , Notes to Pharmacist: as needed Famciclovir 500 MG Tablet TAKE TWO TABLETS BY MOUTH TWO TIMES DAILY FOR 1 DAY AS NEEDED , Notes to Pharmacist: as needed Lotrisone 1-0.05 % Cream 1 application to affected area Externally twice a day as needed , Notes to Pharmacist: as needed Ondansetron HCl 4 MG Tablet TAKE 1 TO 2 TABLETS BY MOUTH 3 TIMES DAILY IF NEEDED FOR NAUSEA , Notes to Pharmacist: as needed Praluent 150mg /mL Prefilled Pens 1 Subcutaneous Every 2 Weeks Promethazine HCl 25 MG Suppository USE 1 UNWRAPPED SUPPOSITORY RECTALLY EVERY 12 HOURS , Notes to Pharmacist: as needed Medication List reviewed and reconciled with the patient Past Medical History Perioral HSV. Hypercholesterolemia. GERD. Colon polyp, adenoma. BPH. Optometry - does not remember. GI - Dr Madilyn Fireman. urology - Dr Mena Goes. Dr. Victorino Dike - ortho. Surgical History Appendectomy Right shoulder rotator cuff repair 04/2012 Colonoscopy 02/09/2008 03/08/13 Laparascopic repair of hiatal hernia, [employer] fundoplication 06/2013 TURP 07/2013 Prostate 10/2015 Colonoscopy 09/29/18, 12/2019 not in the past yr 01/17/2020 Right foot surgery in Dec. 2022 and Tube placed in right ear in February 01/2022 Hernia surgery 03/23/2023 03/2023 Family History Father: deceased, MI, diagnosed with Coronary artery disease Mother: deceased, hyperlipidemia, heart disease, dementia, diagnosed with Coronary artery disease Brother 1: alive, HTN, hyperlipidemia, heart attack, diagnosed with Hypertension, Diabetes Brother2: alive, HTN, hyperlipidemia, heart attack, diagnosed with Diabetes 2 brother(s) . No Family History of Colon Cancer, Polyps, or Liver Disease. Social History    General:  Tobacco use  cigarettes:  Never smoked, Tobacco history last updated  05/25/2023, Vaping  No. EXPOSURE TO PASSIVE  SMOKE: no. Alcohol: yes, Daily, beer, wine, liquor, 1-2. Recreational drug use: no.  Exercise: 5 +. Marital Status: married. Children: 2. 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 Seat belt use: yes. Allergies Lipitor: myalgias - Allergy Red Yeast Rice: myalgias - Allergy Betadine: rash - Side Effects Penicillin V Potassium: pustules/rash - Allergy Adhesive: rash - Allergy Hospitalization/Major Diagnostic Procedure Rotator cuff repair 04/2012 Hernia surgery 03/23/23 none since 06/24 05/2023 Review of Systems    GI PROCEDURE:  Pacemaker/ AICD no.  Artificial heart valves no.  MI/heart attack no.  Abnormal heart rhythm no.  Angina no.  CVA no.  Hypertension no.  Hypotension no.  Asthma, COPD no.  Sleep apnea no.  Seizure disorders no.  Artificial joints no.  Severe DJD no.  Diabetes no.  Significant headaches no.  Vertigo no.  Depression/anxiety no.  Abnormal bleeding no.  Kidney Disease no.  Liver disease no.  Blood transfusion no.  Vital Signs Wt: 161, Wt change: -2.8 lbs, Ht: 67.25, BMI: 25.03, Temp: 98.2, Pulse sitting: 66, BP sitting: 143/85. 1sy 160/84. Examination    Gastroenterology Exam: GENERAL APPEARANCE:  Well developed, well nourished, no active distress, pleasant, no acute distress .  ABDOMEN  Bowel sounds normal, Abdomen not distended, Non-tender to palpation.  PSYCHIATRIC  Alert and oriented x3, mood and affect appear normal..  Assessments 1. History of adenomatous polyp of colon - Z86.010 (Primary)    2. 3-vessel coronary artery disease - I25.10    Treatment 1. History of adenomatous polyp of colon      IMAGING: Colonoscopy (Ordered for 05/25/2023)

## 2023-05-27 NOTE — Telephone Encounter (Signed)
   Patient Name: Lee Collins  DOB: June 22, 1946 MRN: [id number]  Primary Cardiologist: Parke Poisson, MD  Chart reviewed as part of pre-operative protocol coverage. Given past medical history and time since last visit, based on ACC/AHA guidelines, [name] is at acceptable risk for the planned procedure without further cardiovascular testing.   He is to hold ASA now and not continue this until post procedure. The patient was advised that if he develops new symptoms prior to surgery to contact our office to arrange for a follow-up visit, and he verbalized understanding.  I will route this recommendation to the requesting party via Epic fax function and remove from pre-op pool.  Please call with questions.  Joni Reining, NP 05/27/2023, 8:29 AM

## 2023-05-30 ENCOUNTER — Ambulatory Visit (HOSPITAL_BASED_OUTPATIENT_CLINIC_OR_DEPARTMENT_OTHER): Payer: Medicare Other | Admitting: Anesthesiology

## 2023-05-30 ENCOUNTER — Encounter (HOSPITAL_COMMUNITY): Payer: Self-pay | Admitting: Gastroenterology

## 2023-05-30 ENCOUNTER — Ambulatory Visit (HOSPITAL_COMMUNITY)
Admission: RE | Admit: 2023-05-30 | Discharge: 2023-05-30 | Disposition: A | Discharge status: Home / Self Care | Payer: Medicare Other | Attending: Gastroenterology | Admitting: Gastroenterology

## 2023-05-30 ENCOUNTER — Ambulatory Visit (HOSPITAL_COMMUNITY): Payer: Medicare Other | Admitting: Anesthesiology

## 2023-05-30 ENCOUNTER — Encounter (HOSPITAL_COMMUNITY)
Admission: RE | Disposition: A | Discharge status: Home / Self Care | Payer: Self-pay | Source: Home / Self Care | Attending: Gastroenterology

## 2023-05-30 ENCOUNTER — Other Ambulatory Visit: Payer: Self-pay

## 2023-05-30 DIAGNOSIS — Z86010 Personal history of colonic polyps: Secondary | ICD-10-CM | POA: Diagnosis not present

## 2023-05-30 DIAGNOSIS — K648 Other hemorrhoids: Secondary | ICD-10-CM

## 2023-05-30 DIAGNOSIS — K5730 Diverticulosis of large intestine without perforation or abscess without bleeding: Secondary | ICD-10-CM

## 2023-05-30 DIAGNOSIS — E7800 Pure hypercholesterolemia, unspecified: Secondary | ICD-10-CM | POA: Insufficient documentation

## 2023-05-30 DIAGNOSIS — Z7982 Long term (current) use of aspirin: Secondary | ICD-10-CM | POA: Diagnosis not present

## 2023-05-30 DIAGNOSIS — Z09 Encounter for follow-up examination after completed treatment for conditions other than malignant neoplasm: Secondary | ICD-10-CM | POA: Diagnosis not present

## 2023-05-30 DIAGNOSIS — D122 Benign neoplasm of ascending colon: Secondary | ICD-10-CM | POA: Diagnosis not present

## 2023-05-30 DIAGNOSIS — I2510 Atherosclerotic heart disease of native coronary artery without angina pectoris: Secondary | ICD-10-CM | POA: Diagnosis not present

## 2023-05-30 DIAGNOSIS — N400 Enlarged prostate without lower urinary tract symptoms: Secondary | ICD-10-CM | POA: Insufficient documentation

## 2023-05-30 DIAGNOSIS — Z1211 Encounter for screening for malignant neoplasm of colon: Secondary | ICD-10-CM | POA: Diagnosis not present

## 2023-05-30 DIAGNOSIS — K219 Gastro-esophageal reflux disease without esophagitis: Secondary | ICD-10-CM | POA: Diagnosis not present

## 2023-05-30 DIAGNOSIS — K449 Diaphragmatic hernia without obstruction or gangrene: Secondary | ICD-10-CM | POA: Diagnosis not present

## 2023-05-30 DIAGNOSIS — K6389 Other specified diseases of intestine: Secondary | ICD-10-CM | POA: Diagnosis not present

## 2023-05-30 HISTORY — PX: COLONOSCOPY WITH PROPOFOL: SHX5780

## 2023-05-30 HISTORY — PX: POLYPECTOMY: SHX5525

## 2023-05-30 SURGERY — COLONOSCOPY WITH PROPOFOL
Anesthesia: Monitor Anesthesia Care

## 2023-05-30 MED ORDER — LACTATED RINGERS IV SOLN: INTRAVENOUS | Status: DC

## 2023-05-30 MED ORDER — PROPOFOL 500 MG/50ML IV EMUL
INTRAVENOUS | Status: DC | PRN
  Administered 2023-05-30: 175 ug/kg/min via INTRAVENOUS

## 2023-05-30 MED ORDER — SODIUM CHLORIDE 0.9 % IV SOLN: INTRAVENOUS | Status: DC

## 2023-05-30 SURGICAL SUPPLY — 22 items

## 2023-05-30 NOTE — Transfer of Care (Signed)
Immediate Anesthesia Transfer of Care Note  Patient: Lee Collins  Procedure(s) Performed: COLONOSCOPY WITH PROPOFOL POLYPECTOMY  Patient Location: PACU  Anesthesia Type:MAC  Level of Consciousness: sedated, patient cooperative, and responds to stimulation  Airway & Oxygen Therapy: Patient Spontanous Breathing and Patient connected to face mask oxygen  Post-op Assessment: Report given to RN and Post -op Vital signs reviewed and stable  Post vital signs: Reviewed and stable  Last Vitals:  Vitals Value Taken Time  BP 92/58 05/30/23 0946  Temp    Pulse 59 05/30/23 0946  Resp 12 05/30/23 0946  SpO2 100 % 05/30/23 0946    Last Pain:  Vitals:   05/30/23 0821  TempSrc: Temporal  PainSc:          Complications: No notable events documented.

## 2023-05-30 NOTE — Op Note (Signed)
[facility] Patient Name: Lee Collins Procedure Date: 05/30/2023 MRN: [id number] Attending MD: Kerin Salen , MD, [id number] Date of Birth: [birth year] CSN: [id number] Age: 77 Admit Type: Inpatient Procedure:                Colonoscopy Indications:              High risk colon cancer surveillance: Personal                            history of multiple (3 or more) adenomas, Last                            colonoscopy: 2021 Providers:                Kerin Salen, MD, Norman Clay, RN, Cephus Richer, RN,                            Rozetta Nunnery, Technician Referring MD:             Soyla Murphy Medicines:                See the Anesthesia note for documentation of the                            administered medications Complications:            No immediate complications. Estimated Blood Loss:     Estimated blood loss was minimal. Procedure:                Pre-Anesthesia Assessment:                           - Prior to the procedure, a History and Physical                            was performed, and patient medications and                            allergies were reviewed. The patient's tolerance of                            previous anesthesia was also reviewed. The risks                            and benefits of the procedure and the sedation                            options and risks were discussed with the patient.                            All questions were answered, and informed consent                            was obtained. Prior Anticoagulants: The patient has  taken no anticoagulant or antiplatelet agents                            except for aspirin. ASA Grade Assessment: II - A                            patient with mild systemic disease. After reviewing                            the risks and benefits, the patient was deemed in                            satisfactory condition to undergo the procedure.                            After obtaining informed consent, the colonoscope                            was passed under direct vision. Throughout the                            procedure, the patient's blood pressure, pulse, and                            oxygen saturations were monitored continuously. The                            PCF-HQ190L ([id number]) Olympus colonoscope was                            introduced through the anus and advanced to the the                            cecum, identified by appendiceal orifice and                            ileocecal valve. The colonoscopy was performed                            without difficulty. The patient tolerated the                            procedure well. The quality of the bowel                            preparation was fair. The ileocecal valve,                            appendiceal orifice, and rectum were photographed. Scope In: 9:23:10 AM Scope Out: 9:40:28 AM Scope Withdrawal Time: 0 hours 11 minutes 37 seconds  Total Procedure Duration: 0 hours 17 minutes 18 seconds  Findings:      The perianal and digital rectal examinations were normal.      Liquid semi-liquid stool was found in the  entire colon, making       visualization difficult. Lavage of the area was performed, resulting in       clearance with fair visualization.      A 4 mm polyp was found in the ascending colon. The polyp was sessile.       The polyp was removed with a cold biopsy forceps. Resection and       retrieval were complete.      A few medium-mouthed diverticula were found in the sigmoid colon and       descending colon.      Non-bleeding internal hemorrhoids were found during retroflexion. Impression:               - Preparation of the colon was fair.                           - Stool in the entire examined colon.                           - One 4 mm polyp in the ascending colon, removed                            with a cold biopsy forceps. Resected and  retrieved.                           - Diverticulosis in the sigmoid colon and in the                            descending colon.                           - Non-bleeding internal hemorrhoids. Moderate Sedation:      Patient did not receive moderate sedation for this procedure, but       instead received monitored anesthesia care. Recommendation:           - Patient has a contact number available for                            emergencies. The signs and symptoms of potential                            delayed complications were discussed with the                            patient. Return to normal activities tomorrow.                            Written discharge instructions were provided to the                            patient.                           - High fiber diet.                           - Continue present medications.                           -  Await pathology results.                           - Repeat colonoscopy for surveillance based on                            pathology results. Procedure Code(s):        --- Professional ---                           [id number], Colonoscopy, flexible; with biopsy, single                            or multiple Diagnosis Code(s):        --- Professional ---                           Z86.010, Personal history of colonic polyps                           K64.8, Other hemorrhoids                           D12.2, Benign neoplasm of ascending colon                           K57.30, Diverticulosis of large intestine without                            perforation or abscess without bleeding CPT copyright 2022 American Medical Association. All rights reserved. The codes documented in this report are preliminary and upon coder review may  be revised to meet current compliance requirements. Kerin Salen, MD 05/30/2023 9:44:57 AM This report has been signed electronically. Number of Addenda: 0

## 2023-05-30 NOTE — Anesthesia Preprocedure Evaluation (Signed)
Anesthesia Evaluation  Patient identified by MRN, date of birth, ID band Patient awake    Reviewed: Allergy & Precautions, NPO status , Patient's Chart, lab work & pertinent test results  Airway Mallampati: II  TM Distance: >3 FB Neck ROM: Full    Dental no notable dental hx.    Pulmonary neg pulmonary ROS   Pulmonary exam normal        Cardiovascular negative cardio ROS  Rhythm:Regular Rate:Normal     Neuro/Psych negative neurological ROS  negative psych ROS   GI/Hepatic Neg liver ROS, hiatal hernia,GERD  ,,  Endo/Other  negative endocrine ROS    Renal/GU negative Renal ROS  negative genitourinary   Musculoskeletal negative musculoskeletal ROS (+)    Abdominal Normal abdominal exam  (+)   Peds  Hematology Lab Results      Component                Value               Date                      WBC                      4.6                 03/18/2023                HGB                      15.8                03/18/2023                HCT                      45.5                03/18/2023                MCV                      96.2                03/18/2023                PLT                      319                 03/18/2023              Anesthesia Other Findings   Reproductive/Obstetrics                             Anesthesia Physical Anesthesia Plan  ASA: 2  Anesthesia Plan: MAC   Post-op Pain Management:    Induction: Intravenous  PONV Risk Score and Plan: 1 and Propofol infusion and Treatment may vary due to age or medical condition  Airway Management Planned: Simple Face Mask and Nasal Cannula  Additional Equipment: None  Intra-op Plan:   Post-operative Plan:   Informed Consent: I have reviewed the patients History and Physical, chart, labs and discussed the procedure including the risks, benefits and alternatives for the proposed anesthesia with the patient or  authorized representative who has  indicated his/her understanding and acceptance.     Dental advisory given  Plan Discussed with: CRNA  Anesthesia Plan Comments:        Anesthesia Quick Evaluation

## 2023-05-30 NOTE — Anesthesia Postprocedure Evaluation (Signed)
Anesthesia Post Note  Patient: Lee Collins  Procedure(s) Performed: COLONOSCOPY WITH PROPOFOL POLYPECTOMY     Patient location during evaluation: PACU Anesthesia Type: MAC Level of consciousness: awake and alert Pain management: pain level controlled Vital Signs Assessment: post-procedure vital signs reviewed and stable Respiratory status: spontaneous breathing, nonlabored ventilation, respiratory function stable and patient connected to nasal cannula oxygen Cardiovascular status: stable and blood pressure returned to baseline Postop Assessment: no apparent nausea or vomiting Anesthetic complications: no   No notable events documented.  Last Vitals:  Vitals:   05/30/23 0950 05/30/23 1000  BP: (!) 105/56 121/65  Pulse: 60 (!) 57  Resp: 13 13  Temp:    SpO2: 99% 100%    Last Pain:  Vitals:   05/30/23 1000  TempSrc:   PainSc: 0-No pain                 Earl Lites P Aricela Bertagnolli

## 2023-05-30 NOTE — Interval H&P Note (Signed)
History and Physical Interval Note: 77 year old male with history of multiple adenomas for surveillance colonoscopy with propofol.  05/30/2023 8:19 AM  Lee Collins  has presented today for surveillance colonoscopy with propofol, with the diagnosis of hx of colon polyps.  The various methods of treatment have been discussed with the patient and family. After consideration of risks, benefits and other options for treatment, the patient has consented to  Procedure(s): COLONOSCOPY WITH PROPOFOL (N/A) as a surgical intervention.  The patient's history has been reviewed, patient examined, no change in status, stable for surgery.  I have reviewed the patient's chart and labs.  Questions were answered to the patient's satisfaction.     Kerin Salen

## 2023-05-30 NOTE — Discharge Instructions (Signed)
YOU HAD AN ENDOSCOPIC PROCEDURE TODAY: Refer to the procedure report and other information in the discharge instructions given to you for any specific questions about what was found during the examination. If this information does not answer your questions, please call [facility] office at [phone] to clarify.   YOU SHOULD EXPECT: Some feelings of bloating in the abdomen. Passage of more gas than usual. Walking can help get rid of the air that was put into your GI tract during the procedure and reduce the bloating. If you had a lower endoscopy (such as a colonoscopy or flexible sigmoidoscopy) you may notice spotting of blood in your stool or on the toilet paper. Some abdominal soreness may be present for a day or two, also.  DIET: Your first meal following the procedure should be a light meal and then it is ok to progress to your normal diet. A half-sandwich or bowl of soup is an example of a good first meal. Heavy or fried foods are harder to digest and may make you feel nauseous or bloated. Drink plenty of fluids but you should avoid alcoholic beverages for 24 hours. If you had a esophageal dilation, please see attached instructions for diet.    ACTIVITY: Your care partner should take you home directly after the procedure. You should plan to take it easy, moving slowly for the rest of the day. You can resume normal activity the day after the procedure however YOU SHOULD NOT DRIVE, use power tools, machinery or perform tasks that involve climbing or major physical exertion for 24 hours (because of the sedation medicines used during the test).   SYMPTOMS TO REPORT IMMEDIATELY: A gastroenterologist can be reached at any hour. Please call [phone]  for any of the following symptoms:  Following lower endoscopy (colonoscopy, flexible sigmoidoscopy) Excessive amounts of blood in the stool  Significant tenderness, worsening of abdominal pains  Swelling of the abdomen that is new, acute  Fever of 100  or higher   FOLLOW UP:  If any biopsies were taken you will be contacted by phone or by letter within the next 1-3 weeks. Call [phone]  if you have not heard about the biopsies in 3 weeks.  Please also call with any specific questions about appointments or follow up tests. YOU HAD AN ENDOSCOPIC PROCEDURE TODAY: Refer to the procedure report and other information in the discharge instructions given to you for any specific questions about what was found during the examination. If this information does not answer your questions, please call [facility] office at [phone] to clarify.  

## 2023-06-06 ENCOUNTER — Encounter (HOSPITAL_COMMUNITY): Payer: Self-pay | Admitting: Gastroenterology

## 2023-06-23 DIAGNOSIS — L570 Actinic keratosis: Secondary | ICD-10-CM | POA: Diagnosis not present

## 2023-06-23 DIAGNOSIS — D3617 Benign neoplasm of peripheral nerves and autonomic nervous system of trunk, unspecified: Secondary | ICD-10-CM | POA: Diagnosis not present

## 2023-06-23 DIAGNOSIS — D692 Other nonthrombocytopenic purpura: Secondary | ICD-10-CM | POA: Diagnosis not present

## 2023-06-23 DIAGNOSIS — I788 Other diseases of capillaries: Secondary | ICD-10-CM | POA: Diagnosis not present

## 2023-06-23 DIAGNOSIS — D235 Other benign neoplasm of skin of trunk: Secondary | ICD-10-CM | POA: Diagnosis not present

## 2023-07-11 DIAGNOSIS — Z23 Encounter for immunization: Secondary | ICD-10-CM | POA: Diagnosis not present

## 2023-07-14 DIAGNOSIS — N401 Enlarged prostate with lower urinary tract symptoms: Secondary | ICD-10-CM | POA: Diagnosis not present

## 2023-07-14 DIAGNOSIS — R310 Gross hematuria: Secondary | ICD-10-CM | POA: Diagnosis not present

## 2023-07-14 DIAGNOSIS — R3912 Poor urinary stream: Secondary | ICD-10-CM | POA: Diagnosis not present

## 2023-07-19 ENCOUNTER — Encounter: Payer: Self-pay | Admitting: Internal Medicine

## 2023-07-19 DIAGNOSIS — E785 Hyperlipidemia, unspecified: Secondary | ICD-10-CM

## 2023-07-19 DIAGNOSIS — Z23 Encounter for immunization: Secondary | ICD-10-CM | POA: Diagnosis not present

## 2023-07-26 DIAGNOSIS — E785 Hyperlipidemia, unspecified: Secondary | ICD-10-CM | POA: Diagnosis not present

## 2023-07-27 ENCOUNTER — Ambulatory Visit: Payer: Medicare Other | Attending: Internal Medicine | Admitting: Internal Medicine

## 2023-07-27 ENCOUNTER — Encounter: Payer: Self-pay | Admitting: Internal Medicine

## 2023-07-27 VITALS — BP 120/50 | HR 70 | Ht 68.0 in | Wt 161.4 lb

## 2023-07-27 DIAGNOSIS — E785 Hyperlipidemia, unspecified: Secondary | ICD-10-CM | POA: Insufficient documentation

## 2023-07-27 DIAGNOSIS — T466X5A Adverse effect of antihyperlipidemic and antiarteriosclerotic drugs, initial encounter: Secondary | ICD-10-CM | POA: Insufficient documentation

## 2023-07-27 DIAGNOSIS — T466X5D Adverse effect of antihyperlipidemic and antiarteriosclerotic drugs, subsequent encounter: Secondary | ICD-10-CM | POA: Diagnosis not present

## 2023-07-27 DIAGNOSIS — I2510 Atherosclerotic heart disease of native coronary artery without angina pectoris: Secondary | ICD-10-CM | POA: Insufficient documentation

## 2023-07-27 DIAGNOSIS — M791 Myalgia, unspecified site: Secondary | ICD-10-CM | POA: Diagnosis present

## 2023-07-27 DIAGNOSIS — I700 Atherosclerosis of aorta: Secondary | ICD-10-CM | POA: Diagnosis present

## 2023-07-27 LAB — LIPID PANEL
Chol/HDL Ratio: 2.8 {ratio} (ref 0.0–5.0)
Cholesterol, Total: 162 mg/dL (ref 100–199)
HDL: 57 mg/dL (ref >39)
LDL Chol Calc (NIH): 85 mg/dL (ref 0–99)
Triglycerides: 112 mg/dL (ref 0–149)
VLDL Cholesterol Cal: 20 mg/dL (ref 5–40)

## 2023-07-27 NOTE — Patient Instructions (Signed)
    Follow-Up: At [facility], you and your health needs are our priority.  As part of our continuing mission to provide you with exceptional heart care, we have created designated Provider Care Teams.  These Care Teams include your primary Cardiologist (physician) and Advanced Practice Providers (APPs -  Physician Assistants and Nurse Practitioners) who all work together to provide you with the care you need, when you need it.  We recommend signing up for the patient portal called "MyChart".  Sign up information is provided on this After Visit Summary.  MyChart is used to connect with patients for Virtual Visits (Telemedicine).  Patients are able to view lab/test results, encounter notes, upcoming appointments, etc.  Non-urgent messages can be sent to your provider as well.   To learn more about what you can do with MyChart, go to [email or web address].    Your next appointment:   6 month(s)  Provider:   Parke Poisson, MD     Follow up with the pharmacist in January 2025

## 2023-07-27 NOTE — Progress Notes (Signed)
Cardiology Office Note:  .   Date:  07/27/2023  ID:  Juana Montini, DOB 07-23-1946, MRN [id number] PCP: Noberto Retort, MD  [facility] Providers Cardiologist:  Parke Poisson, MD    History of Present Illness: .   Lee Collins is a 77 y.o. male.  Discussed the use of AI scribe software for clinical note transcription with the patient, who gave verbal consent to proceed.  History of Present Illness   The patient, with a history of high cholesterol, has been on Praluent for approximately three to four months. They report a decrease in their LDL cholesterol from 149 to 85, but the goal is to reduce it to less than 70. The patient has been experiencing hand cramps during long drives and leg cramps, which are alleviated by tonic water. They maintain an active lifestyle, including hiking, and follow a diet that includes vegetables, fruits, nuts, and limited meat. The patient is considering switching from Praluent to inclisiran, an infusion medication, due to cost and insurance coverage.     He has a history of an elevated calcium score which after which we pursued coronary CTA which demonstrated multivessel CAD subsequently sent for cardiac catheterization which demonstrated obstructive CAD most severe in the RCA which is diffusely diseased and moderate obstructive disease in the small caliber diagonal and distal LCx proximal LAD had a borderline stenosis.  Given lack of significant symptoms and high exercise tolerance, medical management pursued.  Patient was started on Praluent as noted above.   ROS: negative except per HPI above.  Studies Reviewed: .        Results   LABS LDL: 85 mg/dL (40/98/1191) LDL: 478 mg/dL (29/5621) HDL: 59 mg/dL (30/8657) Triglycerides: 151 mg/dL (84/6962)  DIAGNOSTIC EKG: Normal (02/2023)     Risk Assessment/Calculations:             Physical Exam:   VS:  BP (!) 120/50   Pulse 70   Ht 5\' 8"  (1.727 m)   Wt 161 lb 6.4 oz (73.2 kg)   BMI  24.54 kg/m    Wt Readings from Last 3 Encounters:  07/27/23 161 lb 6.4 oz (73.2 kg)  05/30/23 160 lb (72.6 kg)  04/01/23 164 lb 12.8 oz (74.8 kg)     Physical Exam   CHEST: Lungs clear to auscultation. CARDIOVASCULAR: Heart sounds normal.     GEN: Well nourished, well developed in no acute distress NECK: No JVD; No carotid bruits CARDIAC: RRR, no murmurs, rubs, gallops RESPIRATORY:  Clear to auscultation without rales, wheezing or rhonchi  ABDOMEN: Soft, non-tender, non-distended EXTREMITIES:  No edema; No deformity   ASSESSMENT AND PLAN: .    1. Coronary artery disease involving native coronary artery of native heart without angina pectoris   2. Hyperlipidemia LDL goal <70   3. Myalgia due to statin   4. Coronary artery calcification   5. Aortic atherosclerosis (HCC)     Assessment and Plan    Hyperlipidemia LDL cholesterol of 85, down from 149 after starting Praluent. Discussed the importance of LDL reduction to less than 70, ideally less than 55, to reduce risk of plaque formation and subsequent cardiovascular events. Patient tolerating Praluent well, with minor injection site discomfort when administered in the abdomen. -Continue Praluent injections every two weeks. -Plan to switch to inclisiran infusions twice a year, pending insurance approval. -Set up appointment with pharmacist in January to discuss transition to inclisiran. -Encourage dietary modifications to further reduce LDL, including limiting animal fats and  increasing intake of vegetables, fruits, and nuts.  Muscle Cramps Chronic issue, particularly with long-distance driving. Tonic water and dietary modifications have provided some relief. -Continue tonic water as needed for cramps. -Consider over-the-counter magnesium supplement and maintaining a potassium-rich diet.  CAD     Patient has multivessel CAD but felt to be amenable to medical management in the setting of no significant symptoms and high  exercise tolerance, the patient participates in "14er" races in the [place] without significant symptoms.  We will observe for now, continue aspirin 81 mg daily and continue Praluent for now.  He will have a visit with the pharmacist in January to discuss transitioning to inclisiran per patient preference.           Total time of encounter: 30 minutes total time of encounter, including 20 minutes spent in face-to-face patient care on the date of this encounter. This time includes coordination of care and counseling regarding above mentioned problem list. Remainder of non-face-to-face time involved reviewing chart documents/testing relevant to the patient encounter and documentation in the medical record. I have independently reviewed documentation from referring provider.   Weston Brass, MD, [facility]  [facility]

## 2023-08-09 DIAGNOSIS — R1031 Right lower quadrant pain: Secondary | ICD-10-CM | POA: Diagnosis not present

## 2023-08-09 DIAGNOSIS — Z9889 Other specified postprocedural states: Secondary | ICD-10-CM | POA: Diagnosis not present

## 2023-08-09 DIAGNOSIS — Z8719 Personal history of other diseases of the digestive system: Secondary | ICD-10-CM | POA: Insufficient documentation

## 2023-08-09 DIAGNOSIS — R1032 Left lower quadrant pain: Secondary | ICD-10-CM | POA: Diagnosis not present

## 2023-10-25 ENCOUNTER — Encounter: Payer: Self-pay | Admitting: Pharmacist

## 2023-10-25 ENCOUNTER — Ambulatory Visit: Payer: Medicare Other | Attending: Internal Medicine | Admitting: Pharmacist

## 2023-10-25 ENCOUNTER — Telehealth: Payer: Self-pay | Admitting: Pharmacist

## 2023-10-25 DIAGNOSIS — N400 Enlarged prostate without lower urinary tract symptoms: Secondary | ICD-10-CM | POA: Insufficient documentation

## 2023-10-25 DIAGNOSIS — Z860100 Personal history of colon polyps, unspecified: Secondary | ICD-10-CM | POA: Insufficient documentation

## 2023-10-25 DIAGNOSIS — M791 Myalgia, unspecified site: Secondary | ICD-10-CM | POA: Diagnosis not present

## 2023-10-25 DIAGNOSIS — T466X5D Adverse effect of antihyperlipidemic and antiarteriosclerotic drugs, subsequent encounter: Secondary | ICD-10-CM | POA: Diagnosis not present

## 2023-10-25 DIAGNOSIS — G43109 Migraine with aura, not intractable, without status migrainosus: Secondary | ICD-10-CM | POA: Insufficient documentation

## 2023-10-25 DIAGNOSIS — R931 Abnormal findings on diagnostic imaging of heart and coronary circulation: Secondary | ICD-10-CM | POA: Diagnosis not present

## 2023-10-25 DIAGNOSIS — E7800 Pure hypercholesterolemia, unspecified: Secondary | ICD-10-CM | POA: Insufficient documentation

## 2023-10-25 DIAGNOSIS — E785 Hyperlipidemia, unspecified: Secondary | ICD-10-CM | POA: Insufficient documentation

## 2023-10-25 DIAGNOSIS — B009 Herpesviral infection, unspecified: Secondary | ICD-10-CM | POA: Insufficient documentation

## 2023-10-25 DIAGNOSIS — T466X5A Adverse effect of antihyperlipidemic and antiarteriosclerotic drugs, initial encounter: Secondary | ICD-10-CM | POA: Diagnosis not present

## 2023-10-25 DIAGNOSIS — F5101 Primary insomnia: Secondary | ICD-10-CM | POA: Insufficient documentation

## 2023-10-25 DIAGNOSIS — N529 Male erectile dysfunction, unspecified: Secondary | ICD-10-CM | POA: Insufficient documentation

## 2023-10-25 DIAGNOSIS — I2510 Atherosclerotic heart disease of native coronary artery without angina pectoris: Secondary | ICD-10-CM | POA: Diagnosis not present

## 2023-10-25 DIAGNOSIS — J309 Allergic rhinitis, unspecified: Secondary | ICD-10-CM | POA: Insufficient documentation

## 2023-10-25 LAB — LIPID PANEL
Chol/HDL Ratio: 2.6 {ratio} (ref 0.0–5.0)
Cholesterol, Total: 173 mg/dL (ref 100–199)
HDL: 67 mg/dL (ref >39)
LDL Chol Calc (NIH): 91 mg/dL (ref 0–99)
Triglycerides: 80 mg/dL (ref 0–149)
VLDL Cholesterol Cal: 15 mg/dL (ref 5–40)

## 2023-10-25 NOTE — Telephone Encounter (Signed)
 Leqvio benefits investigation form faxed to [facility]

## 2023-10-25 NOTE — Patient Instructions (Addendum)
 It was nice meeting you today  We would like your LDL to be less than 70  I will submit the forms to Leqvio  and contact you with their response  We can update your lipid panel and you will get your results tomorrow  Please let us  know if you have any further questions  Medford Bolk, PharmD, BCACP, CDCES, CPP [address] [place], [place], [address] Phone: [phone], Fax: [phone]

## 2023-10-25 NOTE — Progress Notes (Signed)
 Patient ID: Lee Collins                 DOB: Dec 09, 1945                    MRN: [id number]     HPI: Lee Collins is a 78 y.o. male patient referred to lipid clinic by Dr Loni. PMH is significant for HLD, CAD, elevated coronary calcium score, aortic atherosclerosis and statin intolerance.  Patient currently managed on Praluent  150mg  every 2 weeks. Reports no adverse effects. Copay is high however so patient interested in pursuing Leqvio .  Last Praluent  injection was due 10/22/22. Last fasting lipid panel on 07/26/23. Patient would like to update before starting any different medications.  Current Medications:  Praluent  150mg    Intolerances:  Statins  Risk Factors:  CAD HLD Coronary calcium Aortic Atherosclerosis  LDL goal: <70   Labs:TC 162, trigs 112, HDL 57, LDL 85 (07/26/23)  Imaging:  1. Coronary artery calcium score 3934 Agatston units. This places the patient in the 99th percentile for age and gender, suggesting high risk for future cardiac events.   2. Borderline hemodynamically significant stenosis proximal-mid RCA, extensive plaque.   3.  Suspect hemodynamically significant stenosis in the mid LAD.   4. Nonobstructive disease in the LCx. There is a small-moderate PLOM with diffuse disease, could have severe stenosis but unable to do FFR of this vessel. Past Medical History:  Diagnosis Date   BPH (benign prostatic hyperplasia)    Complication of anesthesia    Woke up violent one time   GERD (gastroesophageal reflux disease)    Gross hematuria    History of gastroesophageal reflux (GERD)    s/p  HH repair   History of urinary retention     Current Outpatient Medications on File Prior to Visit  Medication Sig Dispense Refill   clotrimazole-betamethasone (LOTRISONE) cream Apply 1 Application topically.     gabapentin  (NEURONTIN ) 100 MG capsule Take 1 capsule by mouth 3 (three) times daily.     Alirocumab  (PRALUENT ) 150 MG/ML SOAJ Inject 1 mL (150 mg total)  into the skin every 14 (fourteen) days. 6 mL 3   aspirin  EC 81 MG tablet Take 1 tablet (81 mg total) by mouth daily. Swallow whole. 150 tablet 2   fluticasone  (FLONASE ) 50 MCG/ACT nasal spray Place 1 spray into both nostrils at bedtime.     ibuprofen (ADVIL) 200 MG tablet Take 200-400 mg by mouth every 6 (six) hours as needed for moderate pain.     ondansetron  (ZOFRAN ) 4 MG tablet Take 4 mg by mouth every 8 (eight) hours as needed for nausea or vomiting.     promethazine  (PHENERGAN ) 25 MG suppository Place 25 mg rectally every 6 (six) hours as needed for nausea or vomiting.     traMADol  (ULTRAM ) 50 MG tablet Take 1-2 tablets (50-100 mg total) by mouth every 6 (six) hours as needed for moderate pain or severe pain. 20 tablet 0   No current facility-administered medications on file prior to visit.    Allergies  Allergen Reactions   Latex Rash   Betadine [Povidone Iodine] Rash   Penicillins Rash and Other (See Comments)    Blister like areas on face    Assessment/Plan:  1. Hyperlipidemia - Patient last LDL 85 which is above goal of <70. Patient currently tolerating Praluent  but copay is currently over 500 dollars a month. Would like to pursue Leqvio  therapy. Discussed mechanism of action, dosing schedule, and possible adverse effects.  Had patient sign benefits investigation form and will fax to [employer]. Update fasting lipid panel today.  Update lipid panel Submit Leqvio  application to [employer]  Lee Collins, PharmD, [place], CDCES, CPP [address] [place], [place], [address] Phone: [phone], Fax: [phone]

## 2023-11-01 ENCOUNTER — Encounter: Payer: Self-pay | Admitting: Pharmacist

## 2023-11-02 ENCOUNTER — Telehealth: Payer: Self-pay | Admitting: Internal Medicine

## 2023-11-02 ENCOUNTER — Telehealth: Payer: Self-pay | Admitting: Pharmacist

## 2023-11-02 ENCOUNTER — Encounter: Payer: Self-pay | Admitting: Pharmacist

## 2023-11-02 DIAGNOSIS — I2510 Atherosclerotic heart disease of native coronary artery without angina pectoris: Secondary | ICD-10-CM

## 2023-11-02 DIAGNOSIS — E785 Hyperlipidemia, unspecified: Secondary | ICD-10-CM

## 2023-11-02 NOTE — Telephone Encounter (Signed)
 Spoke with patient. Advised he has been approved for Leqvio  injections and orders have been placed for infusion center on [address]. Placed repeat lab orders for patient.

## 2023-11-02 NOTE — Telephone Encounter (Signed)
 Patient is returning [facility] Chris's call.   Advised him of the documentation left "fter patient pays 257 dollar deductible Leqvio  should be no charge."  Patient has further questions regarding this as far as what deductible that is as well as what he now needs to do to proceed with scheduling this appt.  He is requesting a detailed message be left with this information if he does not answer when calling back due to being upset with the call que wait time.   Please advise.

## 2023-11-02 NOTE — Telephone Encounter (Signed)
 Called patient to discuss Leqvio  coverage. No answer, lmom. After patient pays 257 dollar deductible Leqvio  should be no charge.  Orders sent to infusion center on [address]

## 2023-11-02 NOTE — Telephone Encounter (Signed)
 Called and spoke to patient. Verified name and DOB.Patient stated he had already talked to [place] who help resolve his issue. No further assistance needed.

## 2023-11-03 ENCOUNTER — Telehealth: Payer: Self-pay | Admitting: Pharmacy Technician

## 2023-11-03 NOTE — Telephone Encounter (Signed)
Lee Collins note:  patient will be scheduled as soon as possible.  Auth Submission: NO AUTH NEEDED Site of care: Site of care: [facility] Payer: Medicare A/B & BCBS Supp Medication & CPT/J Code(s) submitted: Leqvio (Inclisiran) [phone] Route of submission (phone, fax, portal):  Phone # Fax # Auth type: Buy/Bill PB Units/visits requested: x3 Reference number:  Approval from: 11/02/23 to 10/17/24   Medicare will cover 80% and BCBS Supp will pick-up remaining 20%

## 2023-11-03 NOTE — Telephone Encounter (Signed)
Thayer Ohm, Patient will be scheduled as soon as possible.  Selena Batten

## 2023-11-09 ENCOUNTER — Ambulatory Visit: Payer: Medicare Other

## 2023-11-09 VITALS — BP 145/75 | HR 67 | Temp 97.8°F | Resp 16 | Ht 68.0 in | Wt 165.8 lb

## 2023-11-09 DIAGNOSIS — R931 Abnormal findings on diagnostic imaging of heart and coronary circulation: Secondary | ICD-10-CM | POA: Diagnosis not present

## 2023-11-09 DIAGNOSIS — I2510 Atherosclerotic heart disease of native coronary artery without angina pectoris: Secondary | ICD-10-CM

## 2023-11-09 DIAGNOSIS — E785 Hyperlipidemia, unspecified: Secondary | ICD-10-CM | POA: Diagnosis not present

## 2023-11-09 MED ORDER — INCLISIRAN SODIUM 284 MG/1.5ML ~~LOC~~ SOSY
284.0000 mg | PREFILLED_SYRINGE | Freq: Once | SUBCUTANEOUS | Status: AC
  Administered 2023-11-09: 284 mg via SUBCUTANEOUS
  Filled 2023-11-09: qty 1.5

## 2023-11-09 NOTE — Progress Notes (Signed)
Diagnosis: Hyperlipidemia  Provider:  Chilton Greathouse MD  Procedure: Injection  Leqvio (inclisiran), Dose: 284 mg, Site: subcutaneous, Number of injections: 1  Injection Site(s): Right arm  Post Care: Observation period completed  Discharge: Condition: Good, Destination: Home . AVS Provided  Performed by:  Wyvonne Lenz, RN

## 2023-11-14 DIAGNOSIS — R3912 Poor urinary stream: Secondary | ICD-10-CM | POA: Diagnosis not present

## 2023-11-14 DIAGNOSIS — N401 Enlarged prostate with lower urinary tract symptoms: Secondary | ICD-10-CM | POA: Diagnosis not present

## 2023-11-14 DIAGNOSIS — R310 Gross hematuria: Secondary | ICD-10-CM | POA: Diagnosis not present

## 2024-01-06 ENCOUNTER — Encounter: Payer: Self-pay | Admitting: Internal Medicine

## 2024-01-06 DIAGNOSIS — Z79899 Other long term (current) drug therapy: Secondary | ICD-10-CM

## 2024-01-06 DIAGNOSIS — E785 Hyperlipidemia, unspecified: Secondary | ICD-10-CM

## 2024-01-20 DIAGNOSIS — Z79899 Other long term (current) drug therapy: Secondary | ICD-10-CM | POA: Diagnosis not present

## 2024-01-20 DIAGNOSIS — E785 Hyperlipidemia, unspecified: Secondary | ICD-10-CM | POA: Diagnosis not present

## 2024-01-20 LAB — LIPID PANEL
Chol/HDL Ratio: 3.1 ratio (ref 0.0–5.0)
Cholesterol, Total: 170 mg/dL (ref 100–199)
HDL: 54 mg/dL (ref >39)
LDL Chol Calc (NIH): 92 mg/dL (ref 0–99)
Triglycerides: 137 mg/dL (ref 0–149)
VLDL Cholesterol Cal: 24 mg/dL (ref 5–40)

## 2024-01-20 LAB — HEPATIC FUNCTION PANEL
ALT: 16 IU/L (ref 0–44)
AST: 21 IU/L (ref 0–40)
Albumin: 4.1 g/dL (ref 3.8–4.8)
Alkaline Phosphatase: 86 IU/L (ref 44–121)
Bilirubin Total: 1.2 mg/dL (ref 0.0–1.2)
Bilirubin, Direct: 0.35 mg/dL (ref 0.00–0.40)
Total Protein: 6.8 g/dL (ref 6.0–8.5)

## 2024-01-25 DIAGNOSIS — R310 Gross hematuria: Secondary | ICD-10-CM | POA: Diagnosis not present

## 2024-01-25 DIAGNOSIS — N401 Enlarged prostate with lower urinary tract symptoms: Secondary | ICD-10-CM | POA: Diagnosis not present

## 2024-01-25 DIAGNOSIS — R3912 Poor urinary stream: Secondary | ICD-10-CM | POA: Diagnosis not present

## 2024-01-27 ENCOUNTER — Ambulatory Visit: Payer: BLUE CROSS/BLUE SHIELD | Attending: Internal Medicine | Admitting: Internal Medicine

## 2024-01-27 ENCOUNTER — Encounter: Payer: Self-pay | Admitting: Internal Medicine

## 2024-01-27 VITALS — BP 124/66 | HR 65 | Ht 68.0 in | Wt 162.4 lb

## 2024-01-27 DIAGNOSIS — E785 Hyperlipidemia, unspecified: Secondary | ICD-10-CM | POA: Diagnosis not present

## 2024-01-27 DIAGNOSIS — I2510 Atherosclerotic heart disease of native coronary artery without angina pectoris: Secondary | ICD-10-CM | POA: Diagnosis not present

## 2024-01-27 NOTE — Progress Notes (Addendum)
 Cardiology Office Note:  .   Date:  01/27/2024  ID:  Lee Collins, DOB 1946/09/08, MRN [id number] PCP: Lee Retort, MD  [facility] Providers Cardiologist:  Parke Poisson, MD    History of Present Illness: .   Lee Collins is a 78 y.o. male.  Discussed the use of AI scribe software for clinical note transcription with the patient, who gave verbal consent to proceed.  History of Present Illness The patient, with a history of high cholesterol, presents with a chief complaint of increased fatigue. The patient is unsure if this is due to age or other factors. The patient has been on Inclisiran since January and has noticed a slight increase in his cholesterol levels since starting the medication. The patient also reports experiencing cramping in the hands, especially when using tools for a long time. The patient is due for another dose of Inclisiran at the end of the month. 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 The patient's diet has not changed significantly. The patient has a family history of high cholesterol. With fatigue, TSH normal in June 2024.    ROS: negative except per HPI above.  Studies Reviewed: Marland Kitchen   EKG Interpretation Date/Time:  Friday January 27 2024 08:05:37 EDT Ventricular Rate:  65 PR Interval:  224 QRS Duration:  78 QT Interval:  408 QTC Calculation: 424 R Axis:   31  Text Interpretation: Sinus rhythm with 1st degree A-V block with Premature atrial complexes When compared with ECG of 23-Jun-2013 14:26, Premature atrial complexes are now Present PR interval has increased Confirmed by Weston Brass ([id number]) on 01/27/2024 8:37:45 AM   Cath 03/15/23   Prox RCA-1 lesion is 90% stenosed.   Mid RCA lesion is 80% stenosed.   Prox RCA-2 lesion is 90% stenosed.   Mid RCA to Dist RCA lesion is 55% stenosed.   Prox LAD lesion is 60% stenosed.   Mid LAD lesion is 40% stenosed.   1st  Diag lesion is 75% stenosed.   Mid Cx to Dist Cx lesion is 70% stenosed. Results LABS Total cholesterol: 85 (01/20/2024) LDL: 92 (01/20/2024) Triglycerides: 130 (01/20/2024) HDL: 54 (01/20/2024) Triglycerides: 112 (07/2023)  DIAGNOSTIC EKG: PACs (01/27/2024) Risk Assessment/Calculations:             Physical Exam:   VS:  BP 124/66   Pulse 65   Ht 5\' 8"  (1.727 m)   Wt 162 lb 6.4 oz (73.7 kg)   SpO2 98%   BMI 24.69 kg/m    Wt Readings from Last 3 Encounters:  01/27/24 162 lb 6.4 oz (73.7 kg)  11/09/23 165 lb 12.8 oz (75.2 kg)  07/27/23 161 lb 6.4 oz (73.2 kg)     Physical Exam GENERAL: Alert, cooperative, well developed, no acute distress HEENT: Normocephalic, normal oropharynx, moist mucous membranes CHEST: Clear to auscultation bilaterally, no wheezes, rhonchi, or crackles CARDIOVASCULAR: Normal heart rate and rhythm, S1 and S2 normal without murmurs, carotid arteries normal ABDOMEN: Soft, non-tender, non-distended, without organomegaly, normal bowel sounds EXTREMITIES: No cyanosis or edema NEUROLOGICAL: Cranial nerves grossly intact, moves all extremities without gross motor or sensory deficit   ASSESSMENT AND PLAN: .    Assessment and Plan Assessment & Plan Coronary Artery Disease (CAD) Multivessel CAD with 90% proximal lesion in the right coronary artery. Asymptomatic. Emphasized monitoring for symptoms that may require intervention. - Monitor for CAD symptoms, including chest pressure and dyspnea. - Advise adequate  training for physical activities. - Reassess if symptoms develop. - continue inclisiran 248 mg injections planned for 1 month.  Carotid Artery Stenosis Considered due to cognitive changes during running. Agreed to assess carotid arteries given CAD and HLD. - Order carotid ultrasound to assess for carotid artery stenosis.  Hyperlipidemia Lipid levels altered since switching to inclisiran. LDL not optimized and triglycerides increased but within  normal range. Inclisiran may not have reached full effect. Suspected genetic component to cholesterol levels. Discussed potential genetic factors and possible NMR lipid panel. No change in treatment until effect evaluated. - Order lipoprotein A test. - Plan NMR lipid panel in six months. - Continue inclisiran with next dose at month's end. - Monitor lipid levels and reassess in six months.  Follow-up Continue current treatment and lifestyle modifications. - Schedule follow-up in six months to review lipid panel and reassess condition.      Weston Brass, MD, [facility]

## 2024-01-27 NOTE — Patient Instructions (Signed)
 Medication Instructions:  No Changes *If you need a refill on your cardiac medications before your next appointment, please call your pharmacy*  Lab Work: NMR Lipoprofile to do FASTING in about 6 months, prior to returning for your 6 month follow up visit.  Testing/Procedures: Your physician has requested that you have a carotid duplex. This test is an ultrasound of the carotid arteries in your neck. It looks at blood flow through these arteries that supply the brain with blood. Allow one hour for this exam. There are no restrictions or special instructions.   Please note: We ask at that you not bring children with you during ultrasound (echo/ vascular) testing. Due to room size and safety concerns, children are not allowed in the ultrasound rooms during exams. Our front office staff cannot provide observation of children in our lobby area while testing is being conducted. An adult accompanying a patient to their appointment will only be allowed in the ultrasound room at the discretion of the ultrasound technician under special circumstances. We apologize for any inconvenience.   Follow-Up: At [facility], you and your health needs are our priority.  As part of our continuing mission to provide you with exceptional heart care, our providers are all part of one team.  This team includes your primary Cardiologist (physician) and Advanced Practice Providers or APPs (Physician Assistants and Nurse Practitioners) who all work together to provide you with the care you need, when you need it.  Your next appointment:   6 month(s)  Provider:   Parke Poisson, MD      Other Instructions Please call us or send a MyChart message with any Cardiology related questions/concerns.  [phone].  Thank you!         Valet parking services will be available as well.

## 2024-02-07 ENCOUNTER — Ambulatory Visit (INDEPENDENT_AMBULATORY_CARE_PROVIDER_SITE_OTHER)

## 2024-02-07 VITALS — BP 149/79 | HR 59 | Temp 98.2°F | Resp 14 | Ht 68.0 in | Wt 161.6 lb

## 2024-02-07 DIAGNOSIS — E785 Hyperlipidemia, unspecified: Secondary | ICD-10-CM

## 2024-02-07 DIAGNOSIS — I2510 Atherosclerotic heart disease of native coronary artery without angina pectoris: Secondary | ICD-10-CM | POA: Diagnosis not present

## 2024-02-07 DIAGNOSIS — R931 Abnormal findings on diagnostic imaging of heart and coronary circulation: Secondary | ICD-10-CM

## 2024-02-07 MED ORDER — INCLISIRAN SODIUM 284 MG/1.5ML ~~LOC~~ SOSY
284.0000 mg | PREFILLED_SYRINGE | Freq: Once | SUBCUTANEOUS | Status: AC
  Administered 2024-02-07: 284 mg via SUBCUTANEOUS
  Filled 2024-02-07: qty 1.5

## 2024-02-07 NOTE — Progress Notes (Signed)
 Diagnosis: Hyperlipidemia  Provider:  Mannam, Praveen MD  Procedure: Injection  Leqvio  (inclisiran), Dose: 284 mg, Site: subcutaneous, Number of injections: 1  Injection Site(s): Left arm  Post Care: Patient declined observation  Discharge: Condition: Good, Destination: Home . AVS Provided  Performed by:  Leonard Feigel, RN

## 2024-02-08 ENCOUNTER — Ambulatory Visit: Payer: BLUE CROSS/BLUE SHIELD

## 2024-03-01 DIAGNOSIS — Z23 Encounter for immunization: Secondary | ICD-10-CM | POA: Diagnosis not present

## 2024-03-07 ENCOUNTER — Telehealth: Payer: Self-pay

## 2024-03-07 NOTE — Telephone Encounter (Signed)
 Auth Submission: NO AUTH NEEDED Site of care: Site of care: [facility] Payer: Medicare A/B & BCBS Supp Medication & CPT/J Code(s) submitted: Leqvio  (Inclisiran) J1306 Route of submission (phone, fax, portal):  Phone # Fax # Auth type: Buy/Bill PB Units/visits requested: x3 Reference number:  Approval from: 11/02/23 to 11/17/24   Medicare will cover 80% and BCBS Supp will pick-up remaining 20%

## 2024-06-14 DIAGNOSIS — E7800 Pure hypercholesterolemia, unspecified: Secondary | ICD-10-CM | POA: Diagnosis not present

## 2024-06-14 DIAGNOSIS — I2510 Atherosclerotic heart disease of native coronary artery without angina pectoris: Secondary | ICD-10-CM | POA: Diagnosis not present

## 2024-06-14 DIAGNOSIS — Z0000 Encounter for general adult medical examination without abnormal findings: Secondary | ICD-10-CM | POA: Diagnosis not present

## 2024-06-14 DIAGNOSIS — N400 Enlarged prostate without lower urinary tract symptoms: Secondary | ICD-10-CM | POA: Diagnosis not present

## 2024-06-14 DIAGNOSIS — Z23 Encounter for immunization: Secondary | ICD-10-CM | POA: Diagnosis not present

## 2024-06-14 DIAGNOSIS — J309 Allergic rhinitis, unspecified: Secondary | ICD-10-CM | POA: Diagnosis not present

## 2024-06-14 DIAGNOSIS — Z7184 Encounter for health counseling related to travel: Secondary | ICD-10-CM | POA: Diagnosis not present

## 2024-06-15 LAB — LAB REPORT - SCANNED: EGFR: 91

## 2024-06-19 ENCOUNTER — Ambulatory Visit: Payer: Self-pay | Admitting: Internal Medicine

## 2024-06-20 ENCOUNTER — Other Ambulatory Visit: Payer: Self-pay

## 2024-06-20 MED ORDER — EZETIMIBE 10 MG PO TABS
10.0000 mg | ORAL_TABLET | Freq: Every day | ORAL | 3 refills | Status: AC
Start: 2024-06-20 — End: 2024-09-18

## 2024-07-19 DIAGNOSIS — I2510 Atherosclerotic heart disease of native coronary artery without angina pectoris: Secondary | ICD-10-CM | POA: Diagnosis not present

## 2024-07-19 DIAGNOSIS — E785 Hyperlipidemia, unspecified: Secondary | ICD-10-CM | POA: Diagnosis not present

## 2024-07-20 DIAGNOSIS — Z23 Encounter for immunization: Secondary | ICD-10-CM | POA: Diagnosis not present

## 2024-07-20 LAB — NMR, LIPOPROFILE
Cholesterol, Total: 156 mg/dL (ref 100–199)
HDL Particle Number: 41.9 umol/L (ref >=30.5)
HDL-C: 65 mg/dL (ref >39)
LDL Particle Number: 880 nmol/L (ref <1000)
LDL Size: 20.8 nm (ref >20.5)
LDL-C (NIH Calc): 71 mg/dL (ref 0–99)
LP-IR Score: 46 — ABNORMAL HIGH (ref <=45)
Small LDL Particle Number: 363 nmol/L (ref <=527)
Triglycerides: 114 mg/dL (ref 0–149)

## 2024-07-26 ENCOUNTER — Ambulatory Visit: Attending: Cardiology | Admitting: Internal Medicine

## 2024-07-26 ENCOUNTER — Encounter: Payer: Self-pay | Admitting: Internal Medicine

## 2024-07-26 VITALS — BP 134/98 | HR 61 | Ht 68.0 in | Wt 160.2 lb

## 2024-07-26 DIAGNOSIS — H9202 Otalgia, left ear: Secondary | ICD-10-CM | POA: Diagnosis not present

## 2024-07-26 DIAGNOSIS — I2510 Atherosclerotic heart disease of native coronary artery without angina pectoris: Secondary | ICD-10-CM | POA: Diagnosis present

## 2024-07-26 DIAGNOSIS — R5383 Other fatigue: Secondary | ICD-10-CM | POA: Diagnosis not present

## 2024-07-26 DIAGNOSIS — H6121 Impacted cerumen, right ear: Secondary | ICD-10-CM | POA: Diagnosis not present

## 2024-07-26 DIAGNOSIS — E785 Hyperlipidemia, unspecified: Secondary | ICD-10-CM | POA: Insufficient documentation

## 2024-07-26 DIAGNOSIS — Z9622 Myringotomy tube(s) status: Secondary | ICD-10-CM | POA: Diagnosis not present

## 2024-07-26 NOTE — Progress Notes (Signed)
  Cardiology Office Note:  .   Date:  07/26/2024  ID:  Lee Collins, DOB 1946-07-27, MRN [id number] PCP: Arloa Elsie SAUNDERS, MD  [facility] Providers Cardiologist:  Lee DELENA Merck, MD    History of Present Illness: .   Lee Collins is a 78 y.o. male.  Discussed the use of AI scribe software for clinical note transcription with the patient, who gave verbal consent to proceed.  History of Present Illness Lee Collins is a 78 year old male with asymptomatic moderate-severe three vessel coronary artery disease medically managed who presents for a follow-up on lipid management and medication review.  He is on Inclisiran infusions every six months, with the next dose scheduled soon. He has received two doses so far. He started ezetimibe  in early September. Recent labs show total cholesterol at 156 mg/dL, HDL at 65 mg/dL, and LDL at 71 mg/dL.  He maintains a diet low in red meat, consuming it less than twice a week, and focuses on vegetables, chicken, [place], and fish. He avoids sugar and is cautious with saturated fats.  He experiences a sensation of heaviness in his feet while swimming, described as feeling like 'weights' were attached to them. He has a history of leg cramping for which he takes tonic water  daily, which he finds helpful. No cramping or pain in the feet during these episodes.    ROS: negative except per HPI above.  Studies Reviewed: .        Results LABS Total cholesterol: 156 mg/dL HDL: 65 mg/dL LDL: 71 mg/dL Fasting blood glucose: within normal limits Renal function: within normal limits Sodium: within normal limits Potassium: within normal limits Liver function tests: within normal limits Total bilirubin: mildly elevated Risk Assessment/Calculations:       Physical Exam:   VS:  BP (!) 134/98   Pulse 61   Ht 5' 8 (1.727 m)   Wt 160 lb 3.2 oz (72.7 kg)   SpO2 97%   BMI 24.36 kg/m    Wt Readings from Last 3 Encounters:  07/26/24 160 lb  3.2 oz (72.7 kg)  02/07/24 161 lb 9.6 oz (73.3 kg)  01/27/24 162 lb 6.4 oz (73.7 kg)     Physical Exam GENERAL: Alert, cooperative, well developed, no acute distress. HEENT: Normocephalic, normal oropharynx, moist mucous membranes. CHEST: Clear to auscultation bilaterally, no wheezes, rhonchi, or crackles. CARDIOVASCULAR: Normal heart rate and rhythm, S1 and S2 normal without murmurs. ABDOMEN: Soft, non-tender, non-distended, without organomegaly, normal bowel sounds. EXTREMITIES: No cyanosis or edema. NEUROLOGICAL: Cranial nerves grossly intact, moves all extremities without gross motor or sensory deficit.   ASSESSMENT AND PLAN: .    Assessment and Plan Assessment & Plan Atherosclerotic cardiovascular disease with hyperlipidemia Lipid levels improved but LDL not at target. Current regimen well tolerated. - Continue Inclisiran (Leqvio ) 284 mg every six months. - Continue ezetimibe  10 mg daily. - Order lipid panel and lipoprotein A in six months. - Review labs with pharmacist. - Discuss dietary modifications to reduce saturated fat intake.  Fatigue, under evaluation Fatigue possibly related to activity changes and medication. No definite snoring. - Monitor fatigue symptoms and assess correlation with medication and activity levels. - Consider further evaluation if fatigue persists or worsens with PCP.  Altitude sickness, episodic Episodic symptoms during hikes. Acetazolamide prescribed by PCP, no contraindications. - Increase hydration with electrolyte drinks before and during high-altitude activities.  Recording duration: 24 minutes      Britten Parady, MD, FACC

## 2024-07-26 NOTE — Patient Instructions (Signed)
   Lab Work:  Your physician recommends that you return for lab work 6 MONTHS-FASTING  If you have labs (blood work) drawn today and your tests are completely normal, you will receive your results only by: MyChart Message (if you have MyChart) OR A paper copy in the mail If you have any lab test that is abnormal or we need to change your treatment, we will call you to review the results.  Follow-Up: At [facility], you and your health needs are our priority.  As part of our continuing mission to provide you with exceptional heart care, our providers are all part of one team.  This team includes your primary Cardiologist (physician) and Advanced Practice Providers or APPs (Physician Assistants and Nurse Practitioners) who all work together to provide you with the care you need, when you need it.  Your next appointment:   6 month(s)  Provider:   Gayatri A Acharya, MD

## 2024-07-28 DIAGNOSIS — Z23 Encounter for immunization: Secondary | ICD-10-CM | POA: Diagnosis not present

## 2024-08-02 DIAGNOSIS — R310 Gross hematuria: Secondary | ICD-10-CM | POA: Diagnosis not present

## 2024-08-02 DIAGNOSIS — N401 Enlarged prostate with lower urinary tract symptoms: Secondary | ICD-10-CM | POA: Diagnosis not present

## 2024-08-09 ENCOUNTER — Ambulatory Visit: Admitting: *Deleted

## 2024-08-09 VITALS — BP 120/73 | Temp 98.6°F | Resp 16 | Ht 68.0 in | Wt 159.6 lb

## 2024-08-09 DIAGNOSIS — R931 Abnormal findings on diagnostic imaging of heart and coronary circulation: Secondary | ICD-10-CM | POA: Diagnosis not present

## 2024-08-09 DIAGNOSIS — I2510 Atherosclerotic heart disease of native coronary artery without angina pectoris: Secondary | ICD-10-CM

## 2024-08-09 DIAGNOSIS — E785 Hyperlipidemia, unspecified: Secondary | ICD-10-CM | POA: Diagnosis not present

## 2024-08-09 MED ORDER — INCLISIRAN SODIUM 284 MG/1.5ML ~~LOC~~ SOSY
284.0000 mg | PREFILLED_SYRINGE | Freq: Once | SUBCUTANEOUS | Status: AC
  Administered 2024-08-09: 284 mg via SUBCUTANEOUS
  Filled 2024-08-09: qty 1.5

## 2024-08-09 NOTE — Progress Notes (Signed)
 Diagnosis: Hyperlipidemia  Provider:  Chilton Greathouse MD  Procedure: Injection  Leqvio (inclisiran), Dose: 284 mg, Site: subcutaneous, Number of injections: 1  Injection Site(s): Right arm  Post Care: Observation period completed  Discharge: Condition: Good, Destination: Home . AVS Declined  Performed by:  Forrest Moron, RN

## 2024-10-25 ENCOUNTER — Telehealth: Payer: Self-pay | Admitting: Pharmacy Technician

## 2024-10-25 NOTE — Telephone Encounter (Signed)
 Auth Submission: NO AUTH NEEDED Site of care: Site of care: [facility] Payer: MEDICARE A/B & BCBS SUPP Medication & CPT/J Code(s) submitted: Leqvio  (Inclisiran) J1306 Diagnosis Code: E78.5 Route of submission (phone, fax, portal):  Phone # Fax # Auth type: Buy/Bill PB Units/visits requested: X2 DOSES Reference number:  Approval from: 10/25/24 to 10/17/25

## 2024-11-04 ENCOUNTER — Encounter: Payer: Self-pay | Admitting: Internal Medicine

## 2025-01-25 ENCOUNTER — Ambulatory Visit: Admitting: Internal Medicine

## 2025-02-08 ENCOUNTER — Ambulatory Visit
